# Patient Record
Sex: Female | Born: 1970 | Race: White | Hispanic: No | Marital: Single
Health system: Southern US, Community
[De-identification: ages and names within clinical notes are randomized; demographics above are authoritative.]

## PROBLEM LIST (undated history)

## (undated) DIAGNOSIS — J45909 Unspecified asthma, uncomplicated: Secondary | ICD-10-CM

## (undated) DIAGNOSIS — M1990 Unspecified osteoarthritis, unspecified site: Secondary | ICD-10-CM

## (undated) DIAGNOSIS — R42 Dizziness and giddiness: Secondary | ICD-10-CM

## (undated) DIAGNOSIS — K2970 Gastritis, unspecified, without bleeding: Secondary | ICD-10-CM

## (undated) DIAGNOSIS — K449 Diaphragmatic hernia without obstruction or gangrene: Secondary | ICD-10-CM

## (undated) DIAGNOSIS — Z91018 Allergy to other foods: Secondary | ICD-10-CM

## (undated) DIAGNOSIS — F419 Anxiety disorder, unspecified: Secondary | ICD-10-CM

## (undated) DIAGNOSIS — R55 Syncope and collapse: Secondary | ICD-10-CM

## (undated) HISTORY — DX: Unspecified asthma, uncomplicated: J45.909

## (undated) HISTORY — DX: Allergy to other foods: Z91.018

## (undated) HISTORY — PX: ADENOIDECTOMY: SUR15

## (undated) HISTORY — DX: Anxiety disorder, unspecified: F41.9

## (undated) HISTORY — DX: Dizziness and giddiness: R42

## (undated) HISTORY — PX: SHOULDER SURGERY: SHX246

## (undated) HISTORY — DX: Gastritis, unspecified, without bleeding: K29.70

## (undated) HISTORY — DX: Unspecified osteoarthritis, unspecified site: M19.90

## (undated) HISTORY — DX: Syncope and collapse: R55

## (undated) HISTORY — DX: Diaphragmatic hernia without obstruction or gangrene: K44.9

## (undated) HISTORY — PX: TONSILLECTOMY: SUR1361

## (undated) HISTORY — PX: KNEE SURGERY: SHX244

---

## 1998-12-27 ENCOUNTER — Inpatient Hospital Stay: Admission: AD | Admit: 1998-12-27 | Discharge: 1998-12-27 | Payer: Self-pay | Admitting: Obstetrics

## 1998-12-30 ENCOUNTER — Emergency Department (HOSPITAL_COMMUNITY): Admission: EM | Admit: 1998-12-30 | Discharge: 1998-12-30 | Payer: Self-pay | Admitting: Emergency Medicine

## 1998-12-30 ENCOUNTER — Encounter: Payer: Self-pay | Admitting: Emergency Medicine

## 1999-01-01 ENCOUNTER — Inpatient Hospital Stay (HOSPITAL_COMMUNITY): Admission: AD | Admit: 1999-01-01 | Discharge: 1999-01-03 | Payer: Self-pay | Admitting: Obstetrics & Gynecology

## 1999-01-02 ENCOUNTER — Encounter: Payer: Self-pay | Admitting: Obstetrics & Gynecology

## 1999-08-09 ENCOUNTER — Inpatient Hospital Stay (HOSPITAL_COMMUNITY): Admission: AD | Admit: 1999-08-09 | Discharge: 1999-08-11 | Payer: Self-pay | Admitting: Obstetrics and Gynecology

## 1999-08-16 ENCOUNTER — Encounter: Admission: RE | Admit: 1999-08-16 | Discharge: 1999-09-21 | Payer: Self-pay | Admitting: Obstetrics and Gynecology

## 2000-09-29 ENCOUNTER — Other Ambulatory Visit: Admission: RE | Admit: 2000-09-29 | Discharge: 2000-09-29 | Payer: Self-pay | Admitting: Obstetrics and Gynecology

## 2000-12-10 ENCOUNTER — Ambulatory Visit (HOSPITAL_COMMUNITY): Admission: RE | Admit: 2000-12-10 | Discharge: 2000-12-10 | Payer: Self-pay | Admitting: Orthopedic Surgery

## 2000-12-10 ENCOUNTER — Encounter: Payer: Self-pay | Admitting: Orthopedic Surgery

## 2001-08-24 ENCOUNTER — Other Ambulatory Visit: Admission: RE | Admit: 2001-08-24 | Discharge: 2001-08-24 | Payer: Self-pay | Admitting: Obstetrics and Gynecology

## 2002-07-04 ENCOUNTER — Other Ambulatory Visit: Admission: RE | Admit: 2002-07-04 | Discharge: 2002-07-04 | Payer: Self-pay | Admitting: Obstetrics and Gynecology

## 2002-07-18 ENCOUNTER — Encounter: Admission: RE | Admit: 2002-07-18 | Discharge: 2002-07-18 | Payer: Self-pay | Admitting: Gastroenterology

## 2002-07-18 ENCOUNTER — Encounter: Payer: Self-pay | Admitting: Gastroenterology

## 2003-01-28 ENCOUNTER — Inpatient Hospital Stay (HOSPITAL_COMMUNITY): Admission: AD | Admit: 2003-01-28 | Discharge: 2003-01-30 | Payer: Self-pay | Admitting: Obstetrics and Gynecology

## 2003-03-05 ENCOUNTER — Other Ambulatory Visit: Admission: RE | Admit: 2003-03-05 | Discharge: 2003-03-05 | Payer: Self-pay | Admitting: Obstetrics and Gynecology

## 2004-02-29 ENCOUNTER — Emergency Department (HOSPITAL_COMMUNITY): Admission: EM | Admit: 2004-02-29 | Discharge: 2004-02-29 | Payer: Self-pay | Admitting: Internal Medicine

## 2005-02-04 ENCOUNTER — Emergency Department (HOSPITAL_COMMUNITY): Admission: EM | Admit: 2005-02-04 | Discharge: 2005-02-04 | Payer: Self-pay | Admitting: Emergency Medicine

## 2005-02-12 ENCOUNTER — Encounter: Admission: RE | Admit: 2005-02-12 | Discharge: 2005-02-12 | Payer: Self-pay | Admitting: Neurology

## 2005-03-01 ENCOUNTER — Encounter: Admission: RE | Admit: 2005-03-01 | Discharge: 2005-03-01 | Payer: Self-pay | Admitting: Internal Medicine

## 2005-03-05 ENCOUNTER — Ambulatory Visit: Payer: Self-pay | Admitting: Internal Medicine

## 2005-04-28 ENCOUNTER — Ambulatory Visit: Payer: Self-pay | Admitting: Internal Medicine

## 2005-05-31 ENCOUNTER — Encounter: Admission: RE | Admit: 2005-05-31 | Discharge: 2005-05-31 | Payer: Self-pay | Admitting: Obstetrics and Gynecology

## 2006-04-27 ENCOUNTER — Ambulatory Visit: Payer: Self-pay | Admitting: Internal Medicine

## 2006-06-01 ENCOUNTER — Encounter: Admission: RE | Admit: 2006-06-01 | Discharge: 2006-06-01 | Payer: Self-pay | Admitting: Obstetrics and Gynecology

## 2007-03-03 ENCOUNTER — Ambulatory Visit: Payer: Self-pay | Admitting: Internal Medicine

## 2007-06-05 ENCOUNTER — Encounter: Admission: RE | Admit: 2007-06-05 | Discharge: 2007-06-05 | Payer: Self-pay | Admitting: Obstetrics and Gynecology

## 2007-06-08 ENCOUNTER — Encounter: Admission: RE | Admit: 2007-06-08 | Discharge: 2007-06-08 | Payer: Self-pay | Admitting: Obstetrics and Gynecology

## 2008-06-19 ENCOUNTER — Encounter: Admission: RE | Admit: 2008-06-19 | Discharge: 2008-06-19 | Payer: Self-pay | Admitting: Obstetrics and Gynecology

## 2009-07-01 ENCOUNTER — Encounter: Admission: RE | Admit: 2009-07-01 | Discharge: 2009-07-01 | Payer: Self-pay | Admitting: Obstetrics and Gynecology

## 2009-08-29 DIAGNOSIS — K449 Diaphragmatic hernia without obstruction or gangrene: Secondary | ICD-10-CM | POA: Insufficient documentation

## 2009-08-29 DIAGNOSIS — E785 Hyperlipidemia, unspecified: Secondary | ICD-10-CM | POA: Diagnosis present

## 2009-09-18 ENCOUNTER — Encounter: Admission: RE | Admit: 2009-09-18 | Discharge: 2009-09-18 | Payer: Self-pay | Admitting: *Deleted

## 2010-06-09 ENCOUNTER — Encounter: Admission: RE | Admit: 2010-06-09 | Discharge: 2010-06-09 | Payer: Self-pay | Admitting: Obstetrics and Gynecology

## 2010-06-09 ENCOUNTER — Encounter: Admission: RE | Admit: 2010-06-09 | Discharge: 2010-06-09 | Payer: Self-pay | Admitting: *Deleted

## 2010-07-02 ENCOUNTER — Emergency Department (HOSPITAL_COMMUNITY)
Admission: EM | Admit: 2010-07-02 | Discharge: 2010-07-02 | Payer: Self-pay | Source: Home / Self Care | Admitting: Family Medicine

## 2010-07-26 ENCOUNTER — Encounter: Payer: Self-pay | Admitting: Obstetrics and Gynecology

## 2010-09-14 LAB — POCT URINALYSIS DIPSTICK: Nitrite: NEGATIVE

## 2010-10-22 ENCOUNTER — Encounter: Payer: Self-pay | Admitting: Internal Medicine

## 2010-10-23 ENCOUNTER — Encounter: Payer: Self-pay | Admitting: Internal Medicine

## 2010-10-23 ENCOUNTER — Ambulatory Visit (INDEPENDENT_AMBULATORY_CARE_PROVIDER_SITE_OTHER): Payer: Medicare Other | Admitting: Internal Medicine

## 2010-10-23 VITALS — BP 114/81 | HR 76 | Ht 63.0 in | Wt 117.0 lb

## 2010-10-23 DIAGNOSIS — R55 Syncope and collapse: Secondary | ICD-10-CM

## 2010-10-23 NOTE — Patient Instructions (Signed)
Your physician recommends that you schedule a follow-up appointment in: 3 months with Dr Ladona Ridgel  Your physician has recommended you make the following change in your medication: start Florinef 0.1mg  1/2 tablet twice daily at 8am and 2 pm

## 2010-10-23 NOTE — Progress Notes (Signed)
HPI Ms. Bell returns today for followup. I have not seen her for several years. She has a history of neurally mediated syncope. She has done well for several years but over the last year has had increasing stress surrounding a divorce. About 2 months ago the patient had an episode of near syncope. She was standing and felt like her heart was beating slower and harder and as if she would pass out. The patient managed to lay down and her symptoms past. 2 weeks ago she had another episode where she felt like her spell is coming on and passed out. At that time she awoke and felt poorly but did not injure herself. Her symptoms were exactly like she had several years ago when she passed out. She does not eat meat but tries to increase her salt and fluid intake. Unfortunately she eats no food preservatives and hence her salt intake I suspect is actually quite low. Incidentally she notes that her son was diagnosed with neurally mediated syncope. No Known Allergies   Current Outpatient Prescriptions  Medication Sig Dispense Refill  . cyclobenzaprine (FLEXERIL) 5 MG tablet Take 5 mg by mouth 3 (three) times daily as needed.        Marland Kitchen HYDROcodone-acetaminophen (VICODIN) 5-500 MG per tablet Take 1 tablet by mouth every 6 (six) hours as needed.        Marland Kitchen ibuprofen (IBU) 800 MG tablet Take 800 mg by mouth every 8 (eight) hours as needed.        . Misc Natural Products (LUTEIN VISION BLEND PO) Take by mouth daily.        . Multiple Vitamins-Minerals (VITAMINS/MINERALS PO) Take by mouth daily.        . NON FORMULARY OPC-3       . sodium chloride (OCEAN) 0.65 % nasal spray 1 spray by Nasal route as needed.        . Vitamin D-Vitamin K TABS Take by mouth daily.           Past Medical History  Diagnosis Date  . Chronic arthritis   . Syncope and collapse   . Dizzy spells   . Gastritis   . Hiatal hernia   . Esophagitis   . Anxiety     ROS:   All systems reviewed and negative except as noted in the  HPI.   Past Surgical History  Procedure Date  . Shoulder surgery     right  . Knee surgery     left     Family History  Problem Relation Age of Onset  . Kidney disease      family hx of  . Hypertension      family hx of  . Diabetes      family hx of  . Liver disease      family hx of     History   Social History  . Marital Status: Legally Separated    Spouse Name: N/A    Number of Children: N/A  . Years of Education: N/A   Occupational History  . Not on file.   Social History Main Topics  . Smoking status: Never Smoker   . Smokeless tobacco: Not on file  . Alcohol Use: Not on file  . Drug Use: Not on file  . Sexually Active: Not on file   Other Topics Concern  . Not on file   Social History Narrative  . No narrative on file     BP 114/81  Pulse  76  Ht 5\' 3"  (1.6 m)  Wt 117 lb (53.071 kg)  BMI 20.73 kg/m2  Physical Exam:  Well appearing NAD HEENT: Unremarkable Neck:  No JVD, no thyromegally Lymphatics:  No adenopathy Back:  No CVA tenderness Lungs:  Clear. No wheezes. HEART:  Regular rate rhythm, no murmurs, no rubs, no clicks Abd:  Flat, positive bowel sounds, no organomegally, no rebound, no guarding Ext:  2 plus pulses, no edema, no cyanosis, no clubbing Skin:  No rashes no nodules Neuro:  CN II through XII intact, motor grossly intact  EKG Normal sinus rhythm with sinus arrhythmia.  Assess/Plan:

## 2010-10-23 NOTE — Assessment & Plan Note (Signed)
Today I have reviewed the pathophysiology of the patient's problem. She has been her under increasing stress. Her diet is not high in salt.  Today we discussed methods to increase salt in her diet. We also discussed the role of emotional stress on exacerbation of neurally mediated syncope. I talked as well about initiating medical therapy. The patient has a bias against medical therapy and for this reason we spent most of our time discussing ways to increase the salt in her diet. I've also discussed with her the importance of avoiding a syncopal episode. If an increase in for cystoscopy consults in the diet as well as fluid results in no recurrent syncope think she will continue in this way. Otherwise I would insist that she begin taking Florinef. I will see her back in 3 months.

## 2010-10-26 ENCOUNTER — Telehealth: Payer: Self-pay | Admitting: Internal Medicine

## 2010-10-26 NOTE — Telephone Encounter (Signed)
Pt is having a problem with the increase salt

## 2010-10-26 NOTE — Telephone Encounter (Signed)
Spoke with patient she did everything Dr Ladona Ridgel has asked her to do.  Now her BP is 130/85 and she has a bad HA.  I explained to her that she may have increased her salt too quickly and her body not being use to it this is what has happened.  I have asked her to continue with the increase in salt and try and see if the HA get better.  She verbalized the understanding.  Dr Ladona Ridgel aware and says that he is ok with her BP

## 2010-11-02 ENCOUNTER — Telehealth: Payer: Self-pay | Admitting: Internal Medicine

## 2010-11-02 NOTE — Telephone Encounter (Signed)
Per rhonda called this morning re pt syncope for taylor nurse to check on pt.

## 2010-11-02 NOTE — Telephone Encounter (Signed)
I spoke with patient and she says she passed out on Sat  I have advised her that I will talk with Dr Ladona Ridgel tomorrow and let her know what to do  She states that she is under a lot of stress and knows that this is what is the cause.  She got like this when her mother died but it is worse now.  Needs to know if she can take an herbal supp for stress reduction.  Knows it will take a couple of weeks but just is not feeling right.

## 2010-11-02 NOTE — Telephone Encounter (Signed)
Pt called because she had passed out again. She also c/o dizzyness and these Sx are worse with activity. She has taken VS and SBP has dropped some with position change but not less than  100. She is compliant with increasing salt and taking Rx as prescribed. She does not feel well but does not wish to come to ER. The ofc is aware.

## 2010-11-04 ENCOUNTER — Telehealth: Payer: Self-pay | Admitting: Internal Medicine

## 2010-11-04 NOTE — Telephone Encounter (Signed)
Spoke with pt, she is upset because she has not heard back from dr taylor on what she should do since she passed out on sat. She has not had any more episodes since sat but wants to be seen. Will discuss with dr cooper(DOD). Deliah Goody

## 2010-11-04 NOTE — Telephone Encounter (Signed)
Discussed pt with dr cooper, pt reports feeling lightheaded and dizzy but this is the consistent feeling she has had. She is on florinef but states it makes her sick. She cont to eat salt and drink plenty of fluids. Per dr copper pt will cont the same and see dr taylor tomorrow. Pt was cautioned about getting up and down and driving. She will call back today if problems occur Tracey Proctor

## 2010-11-04 NOTE — Telephone Encounter (Signed)
Pt was told kelly would talk with dr taylor yesterday and get back with her and she hasn't heard back, pt would like a call today, pt aware kelly not in today

## 2010-11-05 ENCOUNTER — Ambulatory Visit (INDEPENDENT_AMBULATORY_CARE_PROVIDER_SITE_OTHER): Payer: Medicare Other | Admitting: Internal Medicine

## 2010-11-05 ENCOUNTER — Encounter: Payer: Self-pay | Admitting: Internal Medicine

## 2010-11-05 VITALS — BP 122/86 | HR 72 | Ht 62.0 in | Wt 116.0 lb

## 2010-11-05 DIAGNOSIS — R55 Syncope and collapse: Secondary | ICD-10-CM

## 2010-11-05 NOTE — Progress Notes (Signed)
HPI Tracey Proctor returns today for followup. I had seen her a couple of weeks ago. Prior to that she had not been seen in our EP clinic for several years. She has a history of neurally mediated syncope. Her episodes have been well controlled until she is been through multiple stressors. When I saw her a couple of weeks ago, I recommended that she increase her salt and fluid intake and at that time recommended medical therapy with Florinef. She took 3 doses of this medication. Several days ago the patient passed out again. She notes that she had run approximately 3 miles earlier that day. She felt weak and tired and overall felt poorly. She got up to leave her friend's house and became lightheaded, nauseated, and passed out. She did not seriously injure herself. Since then she has been stable. She has checked her blood pressure on a regular basis both sitting and standing. This demonstrates an approximate 20 BP minute increase in the heart rate when she stands up. No significant change in her blood pressure. The patient has tried to cut back on her caffeine intake. She recently bought a house. No Known Allergies   Current Outpatient Prescriptions  Medication Sig Dispense Refill  . cyclobenzaprine (FLEXERIL) 5 MG tablet Take 5 mg by mouth 3 (three) times daily as needed.        Marland Kitchen HYDROcodone-acetaminophen (VICODIN) 5-500 MG per tablet Take 1 tablet by mouth every 6 (six) hours as needed.        Marland Kitchen ibuprofen (IBU) 800 MG tablet Take 800 mg by mouth every 8 (eight) hours as needed.        . Misc Natural Products (LUTEIN VISION BLEND PO) Take by mouth daily.        . Multiple Vitamins-Minerals (VITAMINS/MINERALS PO) Take by mouth daily.        . NON FORMULARY OPC-3       . sodium chloride (OCEAN) 0.65 % nasal spray 1 spray by Nasal route as needed.        . Vitamin D-Vitamin K TABS Take by mouth daily.        Marland Kitchen DISCONTD: fludrocortisone (FLORINEF) 0.1 MG tablet 1/2 tablet twice daily at 8am and 2pm          Past Medical History  Diagnosis Date  . Chronic arthritis   . Syncope and collapse   . Dizzy spells   . Gastritis   . Hiatal hernia   . Esophagitis   . Anxiety     ROS:   All systems reviewed and negative except as noted in the HPI.   Past Surgical History  Procedure Date  . Shoulder surgery     right  . Knee surgery     left     Family History  Problem Relation Age of Onset  . Kidney disease      family hx of  . Hypertension      family hx of  . Diabetes      family hx of  . Liver disease      family hx of     History   Social History  . Marital Status: Legally Separated    Spouse Name: N/A    Number of Children: N/A  . Years of Education: N/A   Occupational History  . Not on file.   Social History Main Topics  . Smoking status: Never Smoker   . Smokeless tobacco: Not on file  . Alcohol Use: Not on file  .  Drug Use: Not on file  . Sexually Active: Not on file   Other Topics Concern  . Not on file   Social History Narrative  . No narrative on file     BP 122/86  Pulse 72  Ht 5\' 2"  (1.575 m)  Wt 116 lb (52.617 kg)  BMI 21.22 kg/m2  Physical Exam: Her blood pressure equal 112/70 by my exam Well appearing NAD HEENT: Unremarkable Neck:  No JVD, no thyromegally Lymphatics:  No adenopathy Back:  No CVA tenderness Lungs:  Clear HEART:  Regular rate rhythm, no murmurs, no rubs, no clicks Abd:  Flat, positive bowel sounds, no organomegally, no rebound, no guarding Ext:  2 plus pulses, no edema, no cyanosis, no clubbing Skin:  No rashes no nodules Neuro:  CN II through XII intact, motor grossly intact  Assess/Plan:

## 2010-11-05 NOTE — Patient Instructions (Signed)
Your physician recommends that you schedule a follow-up appointment in: 6 weeks with Dr. Taylor  

## 2010-11-05 NOTE — Assessment & Plan Note (Signed)
I have discussed the multifactorial nature behind the patient's episodes of syncope. She is under fairly significant stress.  On the day that she passed out, she ran several miles. I discussed the importance of maintaining horizontal posture when she had an episode that occurs. Also, I discussed the importance of a high salt high fluid low caffeine diet. I continue to recommend that she take her Florinef but she states that she is somewhat reluctant. Other medications could be considered. We'll plan to see her back in several weeks.

## 2010-11-06 ENCOUNTER — Telehealth: Payer: Self-pay | Admitting: Internal Medicine

## 2010-11-06 NOTE — Telephone Encounter (Signed)
Faxed OV to [facility] [employer] ([phone]).

## 2010-11-17 NOTE — Letter (Signed)
March 03, 2007    Julieanne Cotton, MD  [address]  [place] [address]   RE:  Tracey Proctor, Tracey Proctor  MRN:  [id number]  /  DOB:  04/01/1971   Dr. Carney Living,   Today I saw a patient whom we both follow named Letta Pate for EP  followup.  As you know, she is a pleasant 40 year old woman with  shoulder problems but also carries with her a diagnosis of neurally  mediated syncope.  Over the last several years, I have seen her in our  EP clinic here in [place], and she has been fairly stable with regard  to prevention of syncope.  She does continue to have dizzy spells but  has learned to avoid episodes of syncope simply by lying down or sitting  down whenever an episode occurs.  She has also maintained a high sodium  diet at my request to help minimize episodes of syncope.  She notes that  she has had shoulder problems for several years and has had 2 shoulder  operations in the past.  She is now considering additional shoulder  surgery, as she continues to have chronic shoulder pain.  Ms. Shannan Harper  examination today was fairly unremarkable.  Her EKG demonstrates sinus  rhythm with normal axis and intervals.   Overall, Ms. Shannan Harper risk for major cardiovascular complications from  shoulder surgery or, for that matter, any operation would be quite low.  She exercises regularly and has no problems with angina or heart failure  symptoms.  I think she would be a very acceptable cardiovascular risk.  In the perioperative period, if she did develop some hypotension, then  aggressive sodium replacement would be warranted.  Please do not  hesitate to contact me for additional questions about Ms. Bell.    Sincerely,      Doylene Canning. Ladona Ridgel, MD  Electronically Signed    GWT/MedQ  DD: 03/03/2007  DT: 03/05/2007  Job #: [id number]   CC:    Fax [phone] Ms. Alvester Morin

## 2010-11-17 NOTE — Assessment & Plan Note (Signed)
[facility]                         ELECTROPHYSIOLOGY OFFICE NOTE   NAME:BELLShabria, Egley                        MRN:          [id number]  DATE:03/03/2007                            DOB:          [birth year]    Ms. Tracey Proctor returns today for preoperative evaluation prior to pending  shoulder surgery.  The patient is a very pleasant 40 year old woman with  a history of neurally mediated syncope, who also has a history of  chronic arthritic complaints as a consequence of shoulder surgery, which  was done several years ago.  The patient subsequently has continued to  have chronic pain and has sought additional evaluation by Dr. Julieanne Cotton over in [place].  She is considering a major shoulder operation.  The patient has a history of neurally mediated syncope in the past.  Since I saw her, she has not had any additional syncopal episodes but  has been very careful to increase her salt and fluid intake and knows  that when she feels an episode coming on, she is to lie down.  Doing  this, she has been able to not pass out.  She had no other complaints  today.   PHYSICAL EXAMINATION:  She is a pleasant, very well-appearing young  woman in no distress.  Blood pressure 92/66, pulse 72 and regular, respirations were 18.  Weight was 129 pounds.  NECK:  No jugular venous distention.  There was no thyromegaly.  LUNGS:  Clear bilaterally to auscultation.  There are no wheezes, rales  or rhonchi present.  CARDIOVASCULAR:  Regular rate and rhythm with a normal S1 and S2.  There  are no murmurs, rubs or gallops.  EXTREMITIES:  No clubbing, cyanosis or edema.   EKG demonstrates a normal sinus rhythm with normal axis and intervals.   IMPRESSION:  1. Neurally mediated syncope.  2. Chronic shoulder problems.   DISCUSSION:  I think Ms. Tracey Proctor would be just fine in terms of going ahead  with the surgery and her risk for major cardiovascular complications  would be quite  small.  She does have sometimes a susceptibility to a  drop in her blood pressure, and careful hydration would be in order  during surgery.     Doylene Canning. Ladona Ridgel, MD  Electronically Signed    GWT/MedQ  DD: 03/03/2007  DT: 03/05/2007  Job #: [id number]   cc:   Julieanne Cotton

## 2010-11-20 NOTE — Assessment & Plan Note (Signed)
[facility]                           ELECTROPHYSIOLOGY OFFICE NOTE   NAME:Tracey Proctor, Tracey Proctor                        MRN:          [id number]  DATE:04/27/2006                            DOB:          28-Jan-1971    Ms. Tracey Proctor returns today for follow up.  She is a very pleasant young woman  with a history of neurally mediated syncope and chronic anxiety  difficulties, who returns for follow up.  It has been a year since we have  seen her, and she has overall been stable without any recurrent syncope.  She notes that she has had trouble somewhat with her diet, in that she does  not particularly like to maintain a high salt diet and notes that  occasionally she has some peripheral edema in her lower extremities,  particularly around the ankles, particularly at night.  This is always  relieved in the morning.  The patient has denied syncopal episodes in the  last year as noted.  She denies chest pain or shortness of breath.  She is  exercising intermittently.  She has tried to maintain an increased fluid  diet and stay away from caffeinated beverages.   PHYSICAL EXAMINATION:  GENERAL:  She is a pleasant, well-appearing woman in  no distress.  VITAL SIGNS:  Blood pressure 116/81, pulse 58 and regular, respirations 18,  weight 135 pounds.  NECK:  No jugular venous distention.  LUNGS:  Clear bilaterally to auscultation.  There are no wheezes, rales, or  rhonchi.  CARDIOVASCULAR:  Regular rate and rhythm with normal S1 and S2.  EXTREMITIES:  No cyanosis, clubbing, or edema.   The EKG demonstrates sinus bradycardia with normal axis and intervals and no  ST-T wave abnormalities.   IMPRESSION:  1. Recurrent syncope.  2. Chronic anxiety.   DISCUSSION:  Today we spent a fair amount of time talking about dietary  options for the patient as she is concerned about eating preservatives and  worried about eating foods that are high in fat and calories.  She does  admit to some lack of protein in her diet.  I recommended that she consider  branching out and eating fish, though she states that she is concerned about  the  mercury.  I have tried to reassure her that this is not an issue in general.  I will plan to see her back in a year, sooner should she have recurrent  syncopal problems.            ______________________________  Tracey Proctor. Tracey Ridgel, MD     GWT/MedQ  DD:  04/27/2006  DT:  04/28/2006  Job #:  [id number]   cc:   Barry Dienes. Eloise Harman, M.D.

## 2010-11-20 NOTE — H&P (Signed)
   Tracey Proctor, ROBLEDO                           ACCOUNT NO.:  [id number]   MEDICAL RECORD NO.:  [id number]                   PATIENT TYPE:  INP   LOCATION:  9121                                 FACILITY:  [facility]   PHYSICIAN:  Lenoard Aden, M.D.             DATE OF BIRTH:  08-Aug-1970   DATE OF ADMISSION:  01/28/2003  DATE OF DISCHARGE:                                HISTORY & PHYSICAL   CHIEF COMPLAINT:  Active labor.   HISTORY OF PRESENT ILLNESS:  The patient is a 40 year old white female, G2,  P1, EDD of February 06, 2003, at 39 weeks, who presents in active labor.   ALLERGIES:  LACTOSE.   MEDICATIONS:  Prenatal vitamins.   PAST SURGICAL HISTORY:  1. Right shoulder reconstruction.  2. Left knee surgery.  3  Spontaneous vaginal delivery.   PAST MEDICAL HISTORY:  1. Gestational anemia.  2. Hiatal hernia.  3. Gastritis.  4. Esophagitis.   FAMILY HISTORY:  Liver disease, kidney stones, hypertension, and diabetes.   PRENATAL LABORATORY DATA:  Blood type O+.  Rubella immune.  Hepatitis and  HIV negative.   PHYSICAL EXAMINATION:  GENERAL APPEARANCE:  She is a well-developed, well-  nourished, white female in no acute distress.  HEENT:  Normal.  LUNGS:  Clear.  HEART:  Regular rate and rhythm.  ABDOMEN:  Soft, gravid, and nontender.  Estimated fetal weight 6-1/2 to 7  pounds.  PELVIC:  The cervix is 3 cm, 100%, vertex, and 0.  EXTREMITIES:  No cords.  NEUROLOGIC:  Nonfocal.   IMPRESSION:  Term intrauterine pregnancy in active labor.    PLAN:  1. Admit to [facility].  2. Epidural p.r.n.  3. Anticipate attempt at vaginal delivery.                                               Lenoard Aden, M.D.    RJT/MEDQ  D:  01/28/2003  T:  01/28/2003  Job:  [id number]

## 2011-01-13 ENCOUNTER — Telehealth: Payer: Self-pay | Admitting: Internal Medicine

## 2011-01-13 NOTE — Telephone Encounter (Signed)
Pt was called to confirm appt for tomorrow and she cxl said dr Tracey Proctor said ok to cxl if doing ok and she is, she wants to know if and when she should reschedule?

## 2011-01-13 NOTE — Telephone Encounter (Signed)
lmom for pt that I would discuss with Dr Ladona Ridgel appt status and call her back

## 2011-01-14 ENCOUNTER — Ambulatory Visit: Payer: Medicare Other | Admitting: Internal Medicine

## 2011-01-19 NOTE — Telephone Encounter (Signed)
Discussed with Dr Ladona Ridgel and okay to follow up in 6 months

## 2011-01-20 NOTE — Telephone Encounter (Signed)
Pt aware to follow up in 6 months

## 2011-03-23 ENCOUNTER — Telehealth: Payer: Self-pay | Admitting: Internal Medicine

## 2011-03-23 NOTE — Telephone Encounter (Signed)
Pt would like to speak with nurse regarding a episode that happened. Please return call to discuss further.

## 2011-03-23 NOTE — Telephone Encounter (Signed)
Had an episode--last night at 8pm sitting on couch She went to lean back,(like streching No child support for 1 1/2 years) then she got up, feel out.  Having health issues with son (OCD, anxiety he's 11)

## 2011-03-23 NOTE — Telephone Encounter (Signed)
Tracey Proctor feels like a lot of her problems are due to the STRESS Tracey Proctor is under and just wants to know if there is anything different Dr Ladona Ridgel wants to do  Tracey Proctor is still taking in a lot of salt and fluids  Will discuss with Dr Ladona Ridgel tomorrow and call her back

## 2011-03-24 NOTE — Telephone Encounter (Signed)
Dr Ladona Ridgel recommends starting Florinef 0.1mg  bid  And keep her follow up appointment in 04/2011

## 2011-03-29 ENCOUNTER — Encounter: Payer: Self-pay | Admitting: Internal Medicine

## 2011-03-29 NOTE — Telephone Encounter (Signed)
Pt called she is returning your call

## 2011-03-30 MED ORDER — FLUDROCORTISONE ACETATE 0.1 MG PO TABS: 0.1000 mg | ORAL_TABLET | Freq: Two times a day (BID) | ORAL | Status: DC

## 2011-03-30 NOTE — Telephone Encounter (Signed)
This encounter was created in error - please disregard.

## 2011-03-30 NOTE — Telephone Encounter (Signed)
Returned call to patient  She is going to start Florinef 0.1mg  twice daily and let us know if this helps

## 2011-05-06 ENCOUNTER — Other Ambulatory Visit: Payer: Self-pay | Admitting: Obstetrics and Gynecology

## 2011-05-06 DIAGNOSIS — Z1231 Encounter for screening mammogram for malignant neoplasm of breast: Secondary | ICD-10-CM

## 2011-06-09 ENCOUNTER — Other Ambulatory Visit: Payer: Self-pay | Admitting: Allergy and Immunology

## 2011-06-09 ENCOUNTER — Ambulatory Visit
Admission: RE | Admit: 2011-06-09 | Discharge: 2011-06-09 | Disposition: A | Discharge status: Home / Self Care | Payer: Medicare Other | Source: Ambulatory Visit | Attending: Allergy and Immunology | Admitting: Allergy and Immunology

## 2011-06-09 DIAGNOSIS — R059 Cough, unspecified: Secondary | ICD-10-CM

## 2011-06-09 DIAGNOSIS — R05 Cough: Secondary | ICD-10-CM

## 2011-06-10 ENCOUNTER — Emergency Department (HOSPITAL_COMMUNITY)
Admission: EM | Admit: 2011-06-10 | Discharge: 2011-06-10 | Disposition: A | Discharge status: Home / Self Care | Payer: Medicare Other | Attending: Emergency Medicine | Admitting: Emergency Medicine

## 2011-06-10 ENCOUNTER — Encounter (HOSPITAL_COMMUNITY): Payer: Self-pay | Admitting: *Deleted

## 2011-06-10 ENCOUNTER — Other Ambulatory Visit: Payer: Self-pay

## 2011-06-10 DIAGNOSIS — R55 Syncope and collapse: Secondary | ICD-10-CM

## 2011-06-10 DIAGNOSIS — R197 Diarrhea, unspecified: Secondary | ICD-10-CM | POA: Insufficient documentation

## 2011-06-10 DIAGNOSIS — R112 Nausea with vomiting, unspecified: Secondary | ICD-10-CM

## 2011-06-10 LAB — BASIC METABOLIC PANEL
CO2: 24 mEq/L (ref 19–32)
Calcium: 9.4 mg/dL (ref 8.4–10.5)
Chloride: 105 mEq/L (ref 96–112)
Glucose, Bld: 94 mg/dL (ref 70–99)
Sodium: 137 mEq/L (ref 135–145)

## 2011-06-10 LAB — CBC
Hemoglobin: 11.8 g/dL — ABNORMAL LOW (ref 12.0–15.0)
MCH: 25.8 pg — ABNORMAL LOW (ref 26.0–34.0)
RBC: 4.58 MIL/uL (ref 3.87–5.11)

## 2011-06-10 LAB — URINALYSIS, ROUTINE W REFLEX MICROSCOPIC
Glucose, UA: NEGATIVE mg/dL
Hgb urine dipstick: NEGATIVE
Specific Gravity, Urine: 1.014 (ref 1.005–1.030)
pH: 7.5 (ref 5.0–8.0)

## 2011-06-10 LAB — PREGNANCY, URINE: Preg Test, Ur: NEGATIVE

## 2011-06-10 MED ORDER — ONDANSETRON 8 MG PO TBDP: 8.0000 mg | ORAL_TABLET | Freq: Three times a day (TID) | ORAL | Status: AC | PRN

## 2011-06-10 MED ORDER — ONDANSETRON 8 MG PO TBDP: 8.0000 mg | ORAL_TABLET | Freq: Three times a day (TID) | ORAL | Status: DC | PRN

## 2011-06-10 MED ORDER — ONDANSETRON HCL 4 MG/2ML IJ SOLN
INTRAMUSCULAR | Status: AC
  Administered 2011-06-10: 18:00:00
  Filled 2011-06-10: qty 2

## 2011-06-10 MED ORDER — SODIUM CHLORIDE 0.9 % IV BOLUS (SEPSIS)
500.0000 mL | INTRAVENOUS | Status: AC
  Administered 2011-06-10: 500 mL via INTRAVENOUS

## 2011-06-10 NOTE — ED Notes (Signed)
WUJ:[id number]<JY> Expected date:06/10/11<BR> Expected time: 5:38 PM<BR> Means of arrival:Ambulance<BR> Comments:<BR> EMS 61 GC - syncope

## 2011-06-10 NOTE — ED Provider Notes (Signed)
History     CSN: [id number] Arrival date & time: 06/10/2011  5:47 PM   First MD Initiated Contact with Patient 06/10/11 1814      Chief Complaint  Patient presents with  . Loss of Consciousness    (Consider location/radiation/quality/duration/timing/severity/associated sxs/prior treatment) HPI History provided by pt.   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  Has had several syncopal episodes in past for which she is followed by Dr. Ladona Ridgel and maintaining a high salt and high fluid diet.  Per prior chart, Dr. Ladona Ridgel believes her sx are exacerbated by high stress level. Pt reprots 3-4 syncopal episodes today.  Has had a funny feeling in her head for the past 2 days, the same feeling that past syncopal episodes have been associated with.  Came to ED today because episodes have never occurred in multiples.  Had both diarrhea and vomiting this am after eating breakfast.  Has also had a cough for several weeks.  Has been treated w/ prednisone and abx w/out improvement.  Denies fever.   CXR obtained yesterday and neg.  Denies head trauma.  Currently feels well after receiving IV fluids.  Past Medical History  Diagnosis Date  . Chronic arthritis   . Syncope and collapse   . Dizzy spells   . Gastritis   . Hiatal hernia   . Esophagitis   . Anxiety     Past Surgical History  Procedure Date  . Shoulder surgery     right  . Knee surgery     left    Family History  Problem Relation Age of Onset  . Kidney disease      family hx of  . Hypertension      family hx of  . Diabetes      family hx of  . Liver disease      family hx of    History  Substance Use Topics  . Smoking status: Never Smoker   . Smokeless tobacco: Not on file  . Alcohol Use: Not on file    OB History    Grav Para Term Preterm Abortions TAB SAB Ect Mult Living                  Review of Systems  All other systems reviewed and are negative.    Allergies  Review of patient's  allergies indicates no known allergies.  Home Medications   Current Outpatient Rx  Name Route Sig Dispense Refill  . BECLOMETHASONE DIPROPIONATE 40 MCG/ACT IN AERS Inhalation Inhale 2 puffs into the lungs 2 (two) times daily.      . CYCLOBENZAPRINE HCL 5 MG PO TABS Oral Take 5 mg by mouth 3 (three) times daily as needed.      Marland Kitchen FLUDROCORTISONE ACETATE 0.1 MG PO TABS Oral Take 1 tablet (0.1 mg total) by mouth 2 (two) times daily. 60 tablet 6  . HYDROCODONE-ACETAMINOPHEN 5-500 MG PO TABS Oral Take 1 tablet by mouth every 6 (six) hours as needed.      . IBUPROFEN 800 MG PO TABS Oral Take 800 mg by mouth every 8 (eight) hours as needed.      . LUTEIN VISION BLEND PO Oral Take by mouth daily.      Marland Kitchen VITAMINS/MINERALS PO Oral Take by mouth daily.      Marland Kitchen PRILOSEC OTC PO Oral Take 1 tablet by mouth.      Marland Kitchen VITAMIN D-VITAMIN K PO TABS Oral Take by mouth daily.      Marland Kitchen  ALBUTEROL SULFATE HFA 108 (90 BASE) MCG/ACT IN AERS Inhalation Inhale 2 puffs into the lungs every 6 (six) hours as needed.        BP 128/75  Pulse 74  Temp(Src) 98.3 F (36.8 C) (Oral)  Resp 20  SpO2 100%  Physical Exam  Nursing note and vitals reviewed. Constitutional: She is oriented to person, place, and time. She appears well-developed and well-nourished. No distress.  HENT:  Head: Normocephalic and atraumatic.  Mouth/Throat: Oropharynx is clear and moist.  Eyes: Conjunctivae are normal.       Normal appearance  Neck: Normal range of motion.  Cardiovascular: Normal rate, regular rhythm and normal heart sounds.   Pulmonary/Chest: Effort normal and breath sounds normal.  Abdominal: Soft. Bowel sounds are normal. She exhibits no distension. There is no tenderness.  Musculoskeletal: She exhibits no edema and no tenderness.  Neurological: She is alert and oriented to person, place, and time.  Skin: Skin is warm and dry. No rash noted.  Psychiatric: She has a normal mood and affect. Her behavior is normal.    ED Course    Procedures (including critical care time)   Date: 06/10/2011  Rate: 75  Rhythm: normal sinus rhythm  QRS Axis: normal  Intervals: normal  ST/T Wave abnormalities: normal  Conduction Disutrbances:none  Narrative Interpretation:   Old EKG Reviewed: unchanged   Labs Reviewed  CBC - Abnormal; Notable for the following:    Hemoglobin 11.8 (*)    HCT 35.8 (*)    MCH 25.8 (*)    All other components within normal limits  BASIC METABOLIC PANEL  URINALYSIS, ROUTINE W REFLEX MICROSCOPIC  PREGNANCY, URINE   Dg Chest 2 View  06/09/2011  *RADIOLOGY REPORT*  Clinical Data: Cough and shortness of breath.  CHEST - 2 VIEW  Comparison: No comparison studies available.  Findings: The lungs are clear without focal consolidation, edema, effusion or pneumothorax.  Cardiopericardial silhouette is within normal limits for size.  Imaged bony structures of the thorax are intact.  IMPRESSION: Normal exam  Original Report Authenticated By: ERIC A. MANSELL, M.D.     1. Syncope   2. Nausea vomiting and diarrhea       MDM  Pt has neurally mediated syncope and is followed by Dr. Ladona Ridgel.  Had 3-4 syncopal episodes today.  Likely d/t recent emotional stress.  Currently feeling better after 1/2 L NS bolus.  No acute findings on exam.  EKG shows sinus rhythm and is non-ischemic.  Labs unremarkable w/ exception of urinalysis which is pending.  Will recheck vital signs and d/c home when urine results.  Recommended close f/u with Dr. Ladona Ridgel.          Arie Sabina [place], [place] 06/11/11 [id number]

## 2011-06-10 NOTE — ED Notes (Signed)
Per EMS- pt in s/p syncopal episode, pt with history of syncopal episodes and is supposed to be on high sodium diet for this. Pt states syncope is also triggered by stress and she has been under a lot of stress lately and not following her diet. Pt states she had two syncopal episodes during day and two with EMS present. Pt anxious at this time, CBG 126, pt with 18g in LAC, given 4mg  zofran PTA and fluid bolus initiated.

## 2011-06-11 ENCOUNTER — Telehealth: Payer: Self-pay | Admitting: Internal Medicine

## 2011-06-11 ENCOUNTER — Encounter: Payer: Self-pay | Admitting: Internal Medicine

## 2011-06-11 ENCOUNTER — Ambulatory Visit (INDEPENDENT_AMBULATORY_CARE_PROVIDER_SITE_OTHER): Payer: Medicare Other | Admitting: Internal Medicine

## 2011-06-11 ENCOUNTER — Ambulatory Visit: Payer: Medicare Other

## 2011-06-11 VITALS — BP 122/74 | HR 75 | Ht 63.0 in | Wt 117.0 lb

## 2011-06-11 DIAGNOSIS — R55 Syncope and collapse: Secondary | ICD-10-CM

## 2011-06-11 MED ORDER — FLUDROCORTISONE ACETATE 0.1 MG PO TABS: 0.1000 mg | ORAL_TABLET | Freq: Two times a day (BID) | ORAL | Status: DC

## 2011-06-11 NOTE — Telephone Encounter (Signed)
New problem Pt was in er yesterday for syncope and they told her to followup with Dr Ladona Ridgel today.

## 2011-06-11 NOTE — Patient Instructions (Signed)
Your physician recommends that you schedule a follow-up appointment in: 6 weeks  Start Florinef 0.1 mg twice a day

## 2011-06-11 NOTE — Telephone Encounter (Signed)
Spoke with pt and Dr. Ladona Ridgel.  Pt scheduled to see Dr. Ladona Ridgel on 06/15/11.  Pt notified.

## 2011-06-13 ENCOUNTER — Encounter: Payer: Self-pay | Admitting: Internal Medicine

## 2011-06-13 NOTE — Progress Notes (Signed)
HPI Tracey Proctor returns today for followup. She is a pleasant 40 yo woman with a h/o severe neurally mediated syncope. She was seen by me several months ago. At that time she was going through a very bitter divorce which unfortunately persists. The patient has been non-compliant with her meds as well as her diet. She experienced fairly profound nausea and vomiting and passed out several times. She admits to not taking her florinef recently. After her episodes, she feels very weak. These spells eventually resolve. She is anxious as her youngest child has been hopitalized with depression and suicidal ideation. She also notes some bronchitis symptoms and was placed on anti-biotics and steroids. No Known Allergies   Current Outpatient Prescriptions  Medication Sig Dispense Refill  . albuterol (PROVENTIL HFA;VENTOLIN HFA) 108 (90 BASE) MCG/ACT inhaler Inhale 2 puffs into the lungs every 6 (six) hours as needed.        . beclomethasone (QVAR) 40 MCG/ACT inhaler Inhale 2 puffs into the lungs 2 (two) times daily.        . cefUROXime (CEFTIN) 250 MG tablet Take 250 mg by mouth 2 (two) times daily.        . cyclobenzaprine (FLEXERIL) 5 MG tablet Take 5 mg by mouth 3 (three) times daily as needed.        . fludrocortisone (FLORINEF) 0.1 MG tablet Take 1 tablet (0.1 mg total) by mouth 2 (two) times daily.  60 tablet  6  . HYDROcodone-acetaminophen (VICODIN) 5-500 MG per tablet Take 1 tablet by mouth every 6 (six) hours as needed.        Marland Kitchen ibuprofen (IBU) 800 MG tablet Take 800 mg by mouth every 8 (eight) hours as needed.        . Misc Natural Products (LUTEIN VISION BLEND PO) Take by mouth daily.        . Multiple Vitamins-Minerals (VITAMINS/MINERALS PO) Take by mouth daily.        . Omeprazole Magnesium (PRILOSEC OTC PO) Take 1 tablet by mouth.        . ondansetron (ZOFRAN ODT) 8 MG disintegrating tablet Take 1 tablet (8 mg total) by mouth every 8 (eight) hours as needed for nausea.  20 tablet  0  . predniSONE  (DELTASONE) 10 MG tablet Take 10 mg by mouth 2 (two) times daily.        . Vitamin D-Vitamin K TABS Take by mouth daily.        . fludrocortisone (FLORINEF) 0.1 MG tablet Take 1 tablet (0.1 mg total) by mouth 2 (two) times daily.  60 tablet  6     Past Medical History  Diagnosis Date  . Chronic arthritis   . Syncope and collapse   . Dizzy spells   . Gastritis   . Hiatal hernia   . Esophagitis   . Anxiety     ROS:   All systems reviewed and negative except as noted in the HPI.   Past Surgical History  Procedure Date  . Shoulder surgery     right  . Knee surgery     left     Family History  Problem Relation Age of Onset  . Kidney disease      family hx of  . Hypertension      family hx of  . Diabetes      family hx of  . Liver disease      family hx of     History   Social History  .  Marital Status: Legally Separated    Spouse Name: N/A    Number of Children: N/A  . Years of Education: N/A   Occupational History  . Not on file.   Social History Main Topics  . Smoking status: Never Smoker   . Smokeless tobacco: Not on file  . Alcohol Use: Not on file  . Drug Use: Not on file  . Sexually Active: Not on file   Other Topics Concern  . Not on file   Social History Narrative  . No narrative on file     BP 122/74  Pulse 75  Ht 5\' 3"  (1.6 m)  Wt 53.071 kg (117 lb)  BMI 20.73 kg/m2  Physical Exam:  Well appearing NAD HEENT: Unremarkable Neck:  No JVD, no thyromegally Lungs:  Clear with no wheezes, rales, or rhonchi. HEART:  Regular rate rhythm, no murmurs, no rubs, no clicks Abd:  soft, positive bowel sounds, no organomegally, no rebound, no guarding Ext:  2 plus pulses, no edema, no cyanosis, no clubbing Skin:  No rashes no nodules Neuro:  CN II through XII intact, motor grossly intact  Assess/Plan:

## 2011-06-13 NOTE — Assessment & Plan Note (Signed)
She has had an exacerbation secondary to increased stress and not being on her meds. I have asked her to restart her florinef and increase her salt and fluid intake. Mechanisms to cope with her stress have been discussed as well as how she might prevent the episodes by lying down when she feels an episode coming on.

## 2011-06-14 NOTE — ED Provider Notes (Signed)
Medical screening examination/treatment/procedure(s) were performed by non-physician practitioner and as supervising physician I was immediately available for consultation/collaboration.   Harleigh Civello A. Patrica Duel, MD 06/14/11 [phone]

## 2011-06-15 ENCOUNTER — Ambulatory Visit: Payer: Medicare Other | Admitting: Internal Medicine

## 2011-06-16 ENCOUNTER — Ambulatory Visit: Payer: Medicare Other

## 2011-07-19 ENCOUNTER — Ambulatory Visit
Admission: RE | Admit: 2011-07-19 | Discharge: 2011-07-19 | Disposition: A | Discharge status: Home / Self Care | Payer: Medicare Other | Source: Ambulatory Visit | Attending: Obstetrics and Gynecology | Admitting: Obstetrics and Gynecology

## 2011-07-19 DIAGNOSIS — Z1231 Encounter for screening mammogram for malignant neoplasm of breast: Secondary | ICD-10-CM

## 2011-10-27 DIAGNOSIS — D239 Other benign neoplasm of skin, unspecified: Secondary | ICD-10-CM | POA: Diagnosis not present

## 2011-10-27 DIAGNOSIS — D234 Other benign neoplasm of skin of scalp and neck: Secondary | ICD-10-CM | POA: Diagnosis not present

## 2011-10-27 DIAGNOSIS — D235 Other benign neoplasm of skin of trunk: Secondary | ICD-10-CM | POA: Diagnosis not present

## 2011-10-27 DIAGNOSIS — D485 Neoplasm of uncertain behavior of skin: Secondary | ICD-10-CM | POA: Diagnosis not present

## 2011-12-08 DIAGNOSIS — Z01419 Encounter for gynecological examination (general) (routine) without abnormal findings: Secondary | ICD-10-CM | POA: Diagnosis not present

## 2011-12-08 DIAGNOSIS — Z113 Encounter for screening for infections with a predominantly sexual mode of transmission: Secondary | ICD-10-CM | POA: Diagnosis not present

## 2011-12-08 DIAGNOSIS — Z124 Encounter for screening for malignant neoplasm of cervix: Secondary | ICD-10-CM | POA: Diagnosis not present

## 2011-12-14 DIAGNOSIS — Z708 Other sex counseling: Secondary | ICD-10-CM | POA: Diagnosis not present

## 2012-01-25 DIAGNOSIS — Z9889 Other specified postprocedural states: Secondary | ICD-10-CM | POA: Diagnosis not present

## 2012-01-25 DIAGNOSIS — M899 Disorder of bone, unspecified: Secondary | ICD-10-CM | POA: Diagnosis not present

## 2012-01-25 DIAGNOSIS — G8929 Other chronic pain: Secondary | ICD-10-CM | POA: Diagnosis not present

## 2012-01-25 DIAGNOSIS — M25519 Pain in unspecified shoulder: Secondary | ICD-10-CM | POA: Diagnosis not present

## 2012-01-25 DIAGNOSIS — M949 Disorder of cartilage, unspecified: Secondary | ICD-10-CM | POA: Diagnosis not present

## 2012-01-25 DIAGNOSIS — M19019 Primary osteoarthritis, unspecified shoulder: Secondary | ICD-10-CM | POA: Diagnosis not present

## 2012-01-25 DIAGNOSIS — K7689 Other specified diseases of liver: Secondary | ICD-10-CM | POA: Diagnosis not present

## 2012-01-25 DIAGNOSIS — M25819 Other specified joint disorders, unspecified shoulder: Secondary | ICD-10-CM | POA: Diagnosis not present

## 2012-01-25 DIAGNOSIS — R071 Chest pain on breathing: Secondary | ICD-10-CM | POA: Diagnosis not present

## 2012-02-28 DIAGNOSIS — N899 Noninflammatory disorder of vagina, unspecified: Secondary | ICD-10-CM | POA: Diagnosis not present

## 2012-03-29 DIAGNOSIS — M629 Disorder of muscle, unspecified: Secondary | ICD-10-CM | POA: Diagnosis not present

## 2012-03-31 DIAGNOSIS — M629 Disorder of muscle, unspecified: Secondary | ICD-10-CM | POA: Diagnosis not present

## 2012-04-04 DIAGNOSIS — M629 Disorder of muscle, unspecified: Secondary | ICD-10-CM | POA: Diagnosis not present

## 2012-04-10 DIAGNOSIS — M629 Disorder of muscle, unspecified: Secondary | ICD-10-CM | POA: Diagnosis not present

## 2012-05-02 ENCOUNTER — Ambulatory Visit (HOSPITAL_COMMUNITY): Payer: Medicare Other | Admitting: Marriage and Family Therapist

## 2012-05-10 DIAGNOSIS — L290 Pruritus ani: Secondary | ICD-10-CM | POA: Diagnosis not present

## 2012-05-16 DIAGNOSIS — H18839 Recurrent erosion of cornea, unspecified eye: Secondary | ICD-10-CM | POA: Diagnosis not present

## 2012-05-16 DIAGNOSIS — H52229 Regular astigmatism, unspecified eye: Secondary | ICD-10-CM | POA: Diagnosis not present

## 2012-05-16 DIAGNOSIS — H43819 Vitreous degeneration, unspecified eye: Secondary | ICD-10-CM | POA: Diagnosis not present

## 2012-05-16 DIAGNOSIS — H43399 Other vitreous opacities, unspecified eye: Secondary | ICD-10-CM | POA: Diagnosis not present

## 2012-06-09 ENCOUNTER — Other Ambulatory Visit: Payer: Self-pay | Admitting: Obstetrics and Gynecology

## 2012-06-09 DIAGNOSIS — Z1231 Encounter for screening mammogram for malignant neoplasm of breast: Secondary | ICD-10-CM

## 2012-06-12 DIAGNOSIS — S46819A Strain of other muscles, fascia and tendons at shoulder and upper arm level, unspecified arm, initial encounter: Secondary | ICD-10-CM | POA: Diagnosis not present

## 2012-06-12 DIAGNOSIS — S43499A Other sprain of unspecified shoulder joint, initial encounter: Secondary | ICD-10-CM | POA: Diagnosis not present

## 2012-06-12 DIAGNOSIS — Z9889 Other specified postprocedural states: Secondary | ICD-10-CM | POA: Diagnosis not present

## 2012-07-25 ENCOUNTER — Ambulatory Visit
Admission: RE | Admit: 2012-07-25 | Discharge: 2012-07-25 | Disposition: A | Discharge status: Home / Self Care | Payer: Medicare Other | Source: Ambulatory Visit | Attending: Obstetrics and Gynecology | Admitting: Obstetrics and Gynecology

## 2012-07-25 DIAGNOSIS — Z1231 Encounter for screening mammogram for malignant neoplasm of breast: Secondary | ICD-10-CM

## 2012-09-07 DIAGNOSIS — Z79899 Other long term (current) drug therapy: Secondary | ICD-10-CM | POA: Diagnosis not present

## 2012-09-07 DIAGNOSIS — M25519 Pain in unspecified shoulder: Secondary | ICD-10-CM | POA: Diagnosis not present

## 2012-09-07 DIAGNOSIS — M79609 Pain in unspecified limb: Secondary | ICD-10-CM | POA: Diagnosis not present

## 2012-10-02 DIAGNOSIS — D2360 Other benign neoplasm of skin of unspecified upper limb, including shoulder: Secondary | ICD-10-CM | POA: Diagnosis not present

## 2012-10-02 DIAGNOSIS — D485 Neoplasm of uncertain behavior of skin: Secondary | ICD-10-CM | POA: Diagnosis not present

## 2012-10-02 DIAGNOSIS — D2330 Other benign neoplasm of skin of unspecified part of face: Secondary | ICD-10-CM | POA: Diagnosis not present

## 2012-10-02 DIAGNOSIS — D234 Other benign neoplasm of skin of scalp and neck: Secondary | ICD-10-CM | POA: Diagnosis not present

## 2012-10-02 DIAGNOSIS — D235 Other benign neoplasm of skin of trunk: Secondary | ICD-10-CM | POA: Diagnosis not present

## 2012-10-02 DIAGNOSIS — D239 Other benign neoplasm of skin, unspecified: Secondary | ICD-10-CM | POA: Diagnosis not present

## 2012-10-23 DIAGNOSIS — S93609A Unspecified sprain of unspecified foot, initial encounter: Secondary | ICD-10-CM | POA: Diagnosis not present

## 2012-10-23 DIAGNOSIS — S93699A Other sprain of unspecified foot, initial encounter: Secondary | ICD-10-CM | POA: Diagnosis not present

## 2012-10-24 DIAGNOSIS — M2550 Pain in unspecified joint: Secondary | ICD-10-CM | POA: Diagnosis not present

## 2012-10-24 DIAGNOSIS — M79609 Pain in unspecified limb: Secondary | ICD-10-CM | POA: Diagnosis not present

## 2012-11-01 DIAGNOSIS — L909 Atrophic disorder of skin, unspecified: Secondary | ICD-10-CM | POA: Diagnosis not present

## 2012-11-01 DIAGNOSIS — D235 Other benign neoplasm of skin of trunk: Secondary | ICD-10-CM | POA: Diagnosis not present

## 2012-11-01 DIAGNOSIS — D485 Neoplasm of uncertain behavior of skin: Secondary | ICD-10-CM | POA: Diagnosis not present

## 2012-11-01 DIAGNOSIS — D2330 Other benign neoplasm of skin of unspecified part of face: Secondary | ICD-10-CM | POA: Diagnosis not present

## 2012-11-01 DIAGNOSIS — L919 Hypertrophic disorder of the skin, unspecified: Secondary | ICD-10-CM | POA: Diagnosis not present

## 2012-12-13 DIAGNOSIS — Z124 Encounter for screening for malignant neoplasm of cervix: Secondary | ICD-10-CM | POA: Diagnosis not present

## 2012-12-13 DIAGNOSIS — Z01419 Encounter for gynecological examination (general) (routine) without abnormal findings: Secondary | ICD-10-CM | POA: Diagnosis not present

## 2013-02-15 ENCOUNTER — Ambulatory Visit: Payer: Self-pay | Admitting: Psychology

## 2013-03-06 DIAGNOSIS — D2360 Other benign neoplasm of skin of unspecified upper limb, including shoulder: Secondary | ICD-10-CM | POA: Diagnosis not present

## 2013-03-06 DIAGNOSIS — D485 Neoplasm of uncertain behavior of skin: Secondary | ICD-10-CM | POA: Diagnosis not present

## 2013-04-26 DIAGNOSIS — E785 Hyperlipidemia, unspecified: Secondary | ICD-10-CM | POA: Diagnosis not present

## 2013-04-26 DIAGNOSIS — R5381 Other malaise: Secondary | ICD-10-CM | POA: Diagnosis not present

## 2013-05-10 DIAGNOSIS — M899 Disorder of bone, unspecified: Secondary | ICD-10-CM | POA: Diagnosis not present

## 2013-05-10 DIAGNOSIS — M25819 Other specified joint disorders, unspecified shoulder: Secondary | ICD-10-CM | POA: Diagnosis not present

## 2013-06-18 ENCOUNTER — Other Ambulatory Visit: Payer: Self-pay

## 2013-06-18 DIAGNOSIS — F411 Generalized anxiety disorder: Secondary | ICD-10-CM | POA: Diagnosis not present

## 2013-06-18 DIAGNOSIS — Z1231 Encounter for screening mammogram for malignant neoplasm of breast: Secondary | ICD-10-CM

## 2013-07-26 ENCOUNTER — Ambulatory Visit
Admission: RE | Admit: 2013-07-26 | Discharge: 2013-07-26 | Disposition: A | Discharge status: Home / Self Care | Payer: Medicare Other | Source: Ambulatory Visit

## 2013-07-26 DIAGNOSIS — Z1231 Encounter for screening mammogram for malignant neoplasm of breast: Secondary | ICD-10-CM

## 2013-09-03 DIAGNOSIS — D485 Neoplasm of uncertain behavior of skin: Secondary | ICD-10-CM | POA: Diagnosis not present

## 2013-09-03 DIAGNOSIS — D239 Other benign neoplasm of skin, unspecified: Secondary | ICD-10-CM | POA: Diagnosis not present

## 2013-09-03 DIAGNOSIS — R21 Rash and other nonspecific skin eruption: Secondary | ICD-10-CM | POA: Diagnosis not present

## 2013-09-03 DIAGNOSIS — D2370 Other benign neoplasm of skin of unspecified lower limb, including hip: Secondary | ICD-10-CM | POA: Diagnosis not present

## 2013-09-03 DIAGNOSIS — D235 Other benign neoplasm of skin of trunk: Secondary | ICD-10-CM | POA: Diagnosis not present

## 2013-09-05 DIAGNOSIS — M719 Bursopathy, unspecified: Secondary | ICD-10-CM | POA: Diagnosis not present

## 2013-09-05 DIAGNOSIS — M67919 Unspecified disorder of synovium and tendon, unspecified shoulder: Secondary | ICD-10-CM | POA: Diagnosis not present

## 2013-11-06 DIAGNOSIS — H18839 Recurrent erosion of cornea, unspecified eye: Secondary | ICD-10-CM | POA: Diagnosis not present

## 2013-11-06 DIAGNOSIS — H43819 Vitreous degeneration, unspecified eye: Secondary | ICD-10-CM | POA: Diagnosis not present

## 2013-11-06 DIAGNOSIS — H43399 Other vitreous opacities, unspecified eye: Secondary | ICD-10-CM | POA: Diagnosis not present

## 2013-11-28 DIAGNOSIS — L723 Sebaceous cyst: Secondary | ICD-10-CM | POA: Diagnosis not present

## 2013-12-05 DIAGNOSIS — R5381 Other malaise: Secondary | ICD-10-CM | POA: Diagnosis not present

## 2013-12-05 DIAGNOSIS — R5383 Other fatigue: Secondary | ICD-10-CM | POA: Diagnosis not present

## 2013-12-05 DIAGNOSIS — Z01419 Encounter for gynecological examination (general) (routine) without abnormal findings: Secondary | ICD-10-CM | POA: Diagnosis not present

## 2013-12-05 DIAGNOSIS — Z124 Encounter for screening for malignant neoplasm of cervix: Secondary | ICD-10-CM | POA: Diagnosis not present

## 2013-12-10 ENCOUNTER — Telehealth: Payer: Self-pay | Admitting: Internal Medicine

## 2013-12-10 NOTE — Telephone Encounter (Signed)
New Message  Pt called states that she is waking up in the middle of the night from servere palpitations. She called is sleep walking. Pt also states that she is drenched in sweat when she wakes up and her body temperature changes often. Pt also states that she experiences dizziness. Offered pt an ov with Brook Edmisten for 12/11/2013 at 8:30am pt declined. Would rather speak Dr.Taylor. Please assist

## 2013-12-10 NOTE — Telephone Encounter (Signed)
Pt called because she states two nights ago the woke up from sleep she was  in the middle of the room sweating her heart racing. She felt confused . Pt has been very tired and SOB for no reason. The first time it happened several months ago. An appointment was made for pt with DR. Lovena Le on 12/13/13 at 4:15 PM pt aware.

## 2013-12-11 ENCOUNTER — Ambulatory Visit: Payer: Self-pay | Admitting: Cardiology

## 2013-12-13 ENCOUNTER — Ambulatory Visit (INDEPENDENT_AMBULATORY_CARE_PROVIDER_SITE_OTHER): Payer: Medicare Other | Admitting: Internal Medicine

## 2013-12-13 ENCOUNTER — Encounter: Payer: Self-pay | Admitting: Internal Medicine

## 2013-12-13 VITALS — BP 110/72 | HR 74 | Ht 63.5 in | Wt 120.0 lb

## 2013-12-13 DIAGNOSIS — R55 Syncope and collapse: Secondary | ICD-10-CM | POA: Diagnosis not present

## 2013-12-13 DIAGNOSIS — R002 Palpitations: Secondary | ICD-10-CM

## 2013-12-13 MED ORDER — MIDODRINE HCL 2.5 MG PO TABS: ORAL_TABLET | ORAL | Status: DC

## 2013-12-13 NOTE — Patient Instructions (Signed)
Your physician recommends that you schedule a follow-up appointment as needed  Your physician has recommended you make the following change in your medication:  1) Midodrine 2.5mg  twice daily and may take 2 additional daily if needed ---do not exceed 4 in a day

## 2013-12-13 NOTE — Progress Notes (Signed)
HPI Ms. Bell returns today for followup. 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 She continues to be under increased stress as a single parent. She has had 2-4 episodes of syncope a year for the past couple of years. Previously she had been under increased stress when she was undergoing divorce proceedings. She has had multiple other episodes associated with near syncope. She has learned to lie down quickly when she feels like something is going wrong or when she might be about to pass out. She has not injured herself. Previously she tried florinef but developed HTN. Allergies  Allergen Reactions  . Latex Itching and Swelling     Current Outpatient Prescriptions  Medication Sig Dispense Refill  . BIOTIN PO Take 500 mcg by mouth daily.      . cyclobenzaprine (FLEXERIL) 5 MG tablet Take 5 mg by mouth 3 (three) times daily as needed.        Marland Kitchen HYDROcodone-acetaminophen (VICODIN) 5-500 MG per tablet Take 1 tablet by mouth every 6 (six) hours as needed.        Marland Kitchen ibuprofen (IBU) 800 MG tablet Take 800 mg by mouth every 6 (six) hours as needed.       Marland Kitchen LYSINE PO Take 2 tablets by mouth daily.      . Multiple Vitamins-Minerals (VITAMINS/MINERALS PO) Take 1 capsule by mouth daily.       . midodrine (PROAMATINE) 2.5 MG tablet Take 1 tablet twice daily and may take an additional 2 per day  Not to exceed 4 in a 24 hour  60 tablet  6   No current facility-administered medications for this visit.     Past Medical History  Diagnosis Date  . Chronic arthritis   . Syncope and collapse   . Dizzy spells   . Gastritis   . Hiatal hernia   . Esophagitis   . Anxiety     ROS:   All systems reviewed and negative except as noted in the HPI.   Past Surgical History  Procedure Laterality Date  . Shoulder surgery      right  . Knee surgery      left     Family History  Problem Relation Age of Onset  . Kidney  disease      family hx of  . Hypertension      family hx of  . Diabetes      family hx of  . Liver disease      family hx of     History   Social History  . Marital Status: Legally Separated    Spouse Name: N/A    Number of Children: N/A  . Years of Education: N/A   Occupational History  . Not on file.   Social History Main Topics  . Smoking status: Never Smoker   . Smokeless tobacco: Not on file  . Alcohol Use: Not on file  . Drug Use: Not on file  . Sexual Activity: Not on file   Other Topics Concern  . Not on file   Social History Narrative  . No narrative on file     BP 110/72  Pulse 74  Ht 5' 3.5" (1.613 m)  Wt 120 lb (54.432 kg)  BMI 20.92 kg/m2  Physical Exam:  Well appearing 43 yo woman, NAD HEENT: Unremarkable Neck:  No JVD, no thyromegally Back:  No CVA tenderness Lungs:  Clear with no wheezes HEART:  Regular rate rhythm, no murmurs, no rubs, no clicks Abd:  soft, positive bowel sounds, no organomegally, no rebound, no guarding Ext:  2 plus pulses, no edema, no cyanosis, no clubbing Skin:  No rashes no nodules Neuro:  CN II through XII intact, motor grossly intact  EKG - NSR   Assess/Plan:

## 2013-12-13 NOTE — Assessment & Plan Note (Signed)
She continues to have breakthroughs of syncope, typically related to stress. I have asked the patient to try midodrine. In addition, she will avoid caffeine and ETOH and try to maintain a high sodium diet.

## 2014-02-05 DIAGNOSIS — K900 Celiac disease: Secondary | ICD-10-CM | POA: Diagnosis not present

## 2014-03-06 DIAGNOSIS — D235 Other benign neoplasm of skin of trunk: Secondary | ICD-10-CM | POA: Diagnosis not present

## 2014-03-06 DIAGNOSIS — D239 Other benign neoplasm of skin, unspecified: Secondary | ICD-10-CM | POA: Diagnosis not present

## 2014-03-06 DIAGNOSIS — D234 Other benign neoplasm of skin of scalp and neck: Secondary | ICD-10-CM | POA: Diagnosis not present

## 2014-03-06 DIAGNOSIS — D485 Neoplasm of uncertain behavior of skin: Secondary | ICD-10-CM | POA: Diagnosis not present

## 2014-03-14 DIAGNOSIS — M25519 Pain in unspecified shoulder: Secondary | ICD-10-CM | POA: Diagnosis not present

## 2014-03-21 DIAGNOSIS — M25519 Pain in unspecified shoulder: Secondary | ICD-10-CM | POA: Diagnosis not present

## 2014-03-25 DIAGNOSIS — M25519 Pain in unspecified shoulder: Secondary | ICD-10-CM | POA: Diagnosis not present

## 2014-03-29 DIAGNOSIS — M25519 Pain in unspecified shoulder: Secondary | ICD-10-CM | POA: Diagnosis not present

## 2014-04-01 DIAGNOSIS — M25519 Pain in unspecified shoulder: Secondary | ICD-10-CM | POA: Diagnosis not present

## 2014-04-16 DIAGNOSIS — M25511 Pain in right shoulder: Secondary | ICD-10-CM | POA: Diagnosis not present

## 2014-04-18 DIAGNOSIS — R350 Frequency of micturition: Secondary | ICD-10-CM | POA: Diagnosis not present

## 2014-04-19 DIAGNOSIS — R300 Dysuria: Secondary | ICD-10-CM | POA: Diagnosis not present

## 2014-04-19 DIAGNOSIS — M25511 Pain in right shoulder: Secondary | ICD-10-CM | POA: Diagnosis not present

## 2014-04-23 DIAGNOSIS — M25511 Pain in right shoulder: Secondary | ICD-10-CM | POA: Diagnosis not present

## 2014-04-26 DIAGNOSIS — M25511 Pain in right shoulder: Secondary | ICD-10-CM | POA: Diagnosis not present

## 2014-05-01 DIAGNOSIS — M25511 Pain in right shoulder: Secondary | ICD-10-CM | POA: Diagnosis not present

## 2014-05-03 DIAGNOSIS — M25511 Pain in right shoulder: Secondary | ICD-10-CM | POA: Diagnosis not present

## 2014-05-07 DIAGNOSIS — M25511 Pain in right shoulder: Secondary | ICD-10-CM | POA: Diagnosis not present

## 2014-05-16 DIAGNOSIS — M25511 Pain in right shoulder: Secondary | ICD-10-CM | POA: Diagnosis not present

## 2014-05-21 DIAGNOSIS — M25511 Pain in right shoulder: Secondary | ICD-10-CM | POA: Diagnosis not present

## 2014-05-24 DIAGNOSIS — M25511 Pain in right shoulder: Secondary | ICD-10-CM | POA: Diagnosis not present

## 2014-05-28 DIAGNOSIS — M25511 Pain in right shoulder: Secondary | ICD-10-CM | POA: Diagnosis not present

## 2014-05-29 ENCOUNTER — Other Ambulatory Visit: Payer: Self-pay | Admitting: Physician Assistant

## 2014-05-29 DIAGNOSIS — M719 Bursopathy, unspecified: Secondary | ICD-10-CM

## 2014-06-04 DIAGNOSIS — M25511 Pain in right shoulder: Secondary | ICD-10-CM | POA: Diagnosis not present

## 2014-06-06 DIAGNOSIS — M25511 Pain in right shoulder: Secondary | ICD-10-CM | POA: Diagnosis not present

## 2014-06-11 DIAGNOSIS — M25511 Pain in right shoulder: Secondary | ICD-10-CM | POA: Diagnosis not present

## 2014-06-14 ENCOUNTER — Other Ambulatory Visit: Payer: Self-pay

## 2014-06-19 ENCOUNTER — Other Ambulatory Visit: Payer: Self-pay

## 2014-06-19 DIAGNOSIS — Z1231 Encounter for screening mammogram for malignant neoplasm of breast: Secondary | ICD-10-CM

## 2014-06-26 DIAGNOSIS — M25511 Pain in right shoulder: Secondary | ICD-10-CM | POA: Diagnosis not present

## 2014-07-04 DIAGNOSIS — M25511 Pain in right shoulder: Secondary | ICD-10-CM | POA: Diagnosis not present

## 2014-07-29 ENCOUNTER — Ambulatory Visit
Admission: RE | Admit: 2014-07-29 | Discharge: 2014-07-29 | Disposition: A | Discharge status: Home / Self Care | Payer: Medicare Other | Source: Ambulatory Visit

## 2014-07-29 ENCOUNTER — Ambulatory Visit: Payer: Self-pay

## 2014-07-29 DIAGNOSIS — Z1231 Encounter for screening mammogram for malignant neoplasm of breast: Secondary | ICD-10-CM | POA: Diagnosis not present

## 2014-07-30 ENCOUNTER — Other Ambulatory Visit: Payer: Self-pay | Admitting: Obstetrics and Gynecology

## 2014-07-30 DIAGNOSIS — R928 Other abnormal and inconclusive findings on diagnostic imaging of breast: Secondary | ICD-10-CM

## 2014-07-30 DIAGNOSIS — G5681 Other specified mononeuropathies of right upper limb: Secondary | ICD-10-CM | POA: Diagnosis not present

## 2014-08-06 ENCOUNTER — Ambulatory Visit
Admission: RE | Admit: 2014-08-06 | Discharge: 2014-08-06 | Disposition: A | Discharge status: Home / Self Care | Payer: Medicare Other | Source: Ambulatory Visit | Attending: Obstetrics and Gynecology | Admitting: Obstetrics and Gynecology

## 2014-08-06 DIAGNOSIS — R928 Other abnormal and inconclusive findings on diagnostic imaging of breast: Secondary | ICD-10-CM

## 2014-08-06 DIAGNOSIS — Z803 Family history of malignant neoplasm of breast: Secondary | ICD-10-CM | POA: Diagnosis not present

## 2014-08-06 DIAGNOSIS — R922 Inconclusive mammogram: Secondary | ICD-10-CM | POA: Diagnosis not present

## 2014-09-05 DIAGNOSIS — D2261 Melanocytic nevi of right upper limb, including shoulder: Secondary | ICD-10-CM | POA: Diagnosis not present

## 2014-09-05 DIAGNOSIS — D2271 Melanocytic nevi of right lower limb, including hip: Secondary | ICD-10-CM | POA: Diagnosis not present

## 2014-09-05 DIAGNOSIS — D224 Melanocytic nevi of scalp and neck: Secondary | ICD-10-CM | POA: Diagnosis not present

## 2014-09-05 DIAGNOSIS — D2262 Melanocytic nevi of left upper limb, including shoulder: Secondary | ICD-10-CM | POA: Diagnosis not present

## 2014-09-05 DIAGNOSIS — D2272 Melanocytic nevi of left lower limb, including hip: Secondary | ICD-10-CM | POA: Diagnosis not present

## 2014-09-05 DIAGNOSIS — L821 Other seborrheic keratosis: Secondary | ICD-10-CM | POA: Diagnosis not present

## 2014-09-05 DIAGNOSIS — D225 Melanocytic nevi of trunk: Secondary | ICD-10-CM | POA: Diagnosis not present

## 2014-11-07 DIAGNOSIS — Z113 Encounter for screening for infections with a predominantly sexual mode of transmission: Secondary | ICD-10-CM | POA: Diagnosis not present

## 2014-11-07 DIAGNOSIS — Z1321 Encounter for screening for nutritional disorder: Secondary | ICD-10-CM | POA: Diagnosis not present

## 2014-11-07 DIAGNOSIS — Z130 Encounter for screening for diseases of the blood and blood-forming organs and certain disorders involving the immune mechanism: Secondary | ICD-10-CM | POA: Diagnosis not present

## 2014-11-07 DIAGNOSIS — Z13220 Encounter for screening for lipoid disorders: Secondary | ICD-10-CM | POA: Diagnosis not present

## 2014-11-07 DIAGNOSIS — Z1329 Encounter for screening for other suspected endocrine disorder: Secondary | ICD-10-CM | POA: Diagnosis not present

## 2014-11-07 DIAGNOSIS — Z0000 Encounter for general adult medical examination without abnormal findings: Secondary | ICD-10-CM | POA: Diagnosis not present

## 2014-11-07 DIAGNOSIS — R946 Abnormal results of thyroid function studies: Secondary | ICD-10-CM | POA: Diagnosis not present

## 2014-11-21 DIAGNOSIS — Z124 Encounter for screening for malignant neoplasm of cervix: Secondary | ICD-10-CM | POA: Diagnosis not present

## 2014-11-21 DIAGNOSIS — Z01419 Encounter for gynecological examination (general) (routine) without abnormal findings: Secondary | ICD-10-CM | POA: Diagnosis not present

## 2014-12-31 DIAGNOSIS — H18832 Recurrent erosion of cornea, left eye: Secondary | ICD-10-CM | POA: Diagnosis not present

## 2015-03-04 DIAGNOSIS — H04123 Dry eye syndrome of bilateral lacrimal glands: Secondary | ICD-10-CM | POA: Diagnosis not present

## 2015-03-04 DIAGNOSIS — H18833 Recurrent erosion of cornea, bilateral: Secondary | ICD-10-CM | POA: Diagnosis not present

## 2015-03-06 DIAGNOSIS — D485 Neoplasm of uncertain behavior of skin: Secondary | ICD-10-CM | POA: Diagnosis not present

## 2015-03-06 DIAGNOSIS — D2262 Melanocytic nevi of left upper limb, including shoulder: Secondary | ICD-10-CM | POA: Diagnosis not present

## 2015-03-06 DIAGNOSIS — D2271 Melanocytic nevi of right lower limb, including hip: Secondary | ICD-10-CM | POA: Diagnosis not present

## 2015-03-06 DIAGNOSIS — D225 Melanocytic nevi of trunk: Secondary | ICD-10-CM | POA: Diagnosis not present

## 2015-03-06 DIAGNOSIS — D2272 Melanocytic nevi of left lower limb, including hip: Secondary | ICD-10-CM | POA: Diagnosis not present

## 2015-03-06 DIAGNOSIS — D2261 Melanocytic nevi of right upper limb, including shoulder: Secondary | ICD-10-CM | POA: Diagnosis not present

## 2015-04-21 DIAGNOSIS — H18833 Recurrent erosion of cornea, bilateral: Secondary | ICD-10-CM | POA: Diagnosis not present

## 2015-05-04 DIAGNOSIS — Z882 Allergy status to sulfonamides status: Secondary | ICD-10-CM | POA: Diagnosis not present

## 2015-05-04 DIAGNOSIS — R112 Nausea with vomiting, unspecified: Secondary | ICD-10-CM | POA: Diagnosis not present

## 2015-05-04 DIAGNOSIS — Z91040 Latex allergy status: Secondary | ICD-10-CM | POA: Diagnosis not present

## 2015-05-04 DIAGNOSIS — Z91018 Allergy to other foods: Secondary | ICD-10-CM | POA: Diagnosis not present

## 2015-05-04 DIAGNOSIS — R55 Syncope and collapse: Secondary | ICD-10-CM | POA: Diagnosis not present

## 2015-05-04 DIAGNOSIS — H16002 Unspecified corneal ulcer, left eye: Secondary | ICD-10-CM | POA: Diagnosis not present

## 2015-05-05 DIAGNOSIS — H18832 Recurrent erosion of cornea, left eye: Secondary | ICD-10-CM | POA: Diagnosis not present

## 2015-06-19 DIAGNOSIS — H18833 Recurrent erosion of cornea, bilateral: Secondary | ICD-10-CM | POA: Diagnosis not present

## 2015-07-08 ENCOUNTER — Other Ambulatory Visit: Payer: Self-pay

## 2015-07-08 DIAGNOSIS — Z1231 Encounter for screening mammogram for malignant neoplasm of breast: Secondary | ICD-10-CM

## 2015-07-10 ENCOUNTER — Other Ambulatory Visit: Payer: Self-pay | Admitting: Obstetrics and Gynecology

## 2015-07-10 DIAGNOSIS — H18832 Recurrent erosion of cornea, left eye: Secondary | ICD-10-CM | POA: Diagnosis not present

## 2015-07-10 DIAGNOSIS — H04123 Dry eye syndrome of bilateral lacrimal glands: Secondary | ICD-10-CM | POA: Diagnosis not present

## 2015-07-10 DIAGNOSIS — Z803 Family history of malignant neoplasm of breast: Secondary | ICD-10-CM

## 2015-07-16 DIAGNOSIS — H18832 Recurrent erosion of cornea, left eye: Secondary | ICD-10-CM | POA: Diagnosis not present

## 2015-07-31 ENCOUNTER — Ambulatory Visit
Admission: RE | Admit: 2015-07-31 | Discharge: 2015-07-31 | Disposition: A | Discharge status: Home / Self Care | Payer: Medicare Other | Source: Ambulatory Visit

## 2015-07-31 DIAGNOSIS — Z1231 Encounter for screening mammogram for malignant neoplasm of breast: Secondary | ICD-10-CM

## 2015-08-28 DIAGNOSIS — M25511 Pain in right shoulder: Secondary | ICD-10-CM | POA: Diagnosis not present

## 2015-08-28 DIAGNOSIS — G8929 Other chronic pain: Secondary | ICD-10-CM | POA: Diagnosis not present

## 2015-12-02 DIAGNOSIS — D2271 Melanocytic nevi of right lower limb, including hip: Secondary | ICD-10-CM | POA: Diagnosis not present

## 2015-12-02 DIAGNOSIS — D2261 Melanocytic nevi of right upper limb, including shoulder: Secondary | ICD-10-CM | POA: Diagnosis not present

## 2015-12-02 DIAGNOSIS — D2262 Melanocytic nevi of left upper limb, including shoulder: Secondary | ICD-10-CM | POA: Diagnosis not present

## 2015-12-02 DIAGNOSIS — D485 Neoplasm of uncertain behavior of skin: Secondary | ICD-10-CM | POA: Diagnosis not present

## 2015-12-02 DIAGNOSIS — D2239 Melanocytic nevi of other parts of face: Secondary | ICD-10-CM | POA: Diagnosis not present

## 2015-12-02 DIAGNOSIS — D225 Melanocytic nevi of trunk: Secondary | ICD-10-CM | POA: Diagnosis not present

## 2015-12-02 DIAGNOSIS — D2272 Melanocytic nevi of left lower limb, including hip: Secondary | ICD-10-CM | POA: Diagnosis not present

## 2015-12-02 DIAGNOSIS — D224 Melanocytic nevi of scalp and neck: Secondary | ICD-10-CM | POA: Diagnosis not present

## 2015-12-23 DIAGNOSIS — Z13220 Encounter for screening for lipoid disorders: Secondary | ICD-10-CM | POA: Diagnosis not present

## 2015-12-23 DIAGNOSIS — Z130 Encounter for screening for diseases of the blood and blood-forming organs and certain disorders involving the immune mechanism: Secondary | ICD-10-CM | POA: Diagnosis not present

## 2015-12-23 DIAGNOSIS — Z0000 Encounter for general adult medical examination without abnormal findings: Secondary | ICD-10-CM | POA: Diagnosis not present

## 2015-12-23 DIAGNOSIS — Z1329 Encounter for screening for other suspected endocrine disorder: Secondary | ICD-10-CM | POA: Diagnosis not present

## 2016-01-16 DIAGNOSIS — Z124 Encounter for screening for malignant neoplasm of cervix: Secondary | ICD-10-CM | POA: Diagnosis not present

## 2016-01-16 DIAGNOSIS — Z01419 Encounter for gynecological examination (general) (routine) without abnormal findings: Secondary | ICD-10-CM | POA: Diagnosis not present

## 2016-02-05 DIAGNOSIS — H52203 Unspecified astigmatism, bilateral: Secondary | ICD-10-CM | POA: Diagnosis not present

## 2016-02-05 DIAGNOSIS — H18833 Recurrent erosion of cornea, bilateral: Secondary | ICD-10-CM | POA: Diagnosis not present

## 2016-02-05 DIAGNOSIS — H43813 Vitreous degeneration, bilateral: Secondary | ICD-10-CM | POA: Diagnosis not present

## 2016-02-05 DIAGNOSIS — H43393 Other vitreous opacities, bilateral: Secondary | ICD-10-CM | POA: Diagnosis not present

## 2016-02-06 DIAGNOSIS — K588 Other irritable bowel syndrome: Secondary | ICD-10-CM | POA: Diagnosis not present

## 2016-02-06 DIAGNOSIS — K625 Hemorrhage of anus and rectum: Secondary | ICD-10-CM | POA: Diagnosis not present

## 2016-02-06 DIAGNOSIS — K219 Gastro-esophageal reflux disease without esophagitis: Secondary | ICD-10-CM | POA: Diagnosis not present

## 2016-03-10 DIAGNOSIS — R1031 Right lower quadrant pain: Secondary | ICD-10-CM | POA: Diagnosis not present

## 2016-05-21 DIAGNOSIS — R1031 Right lower quadrant pain: Secondary | ICD-10-CM | POA: Diagnosis not present

## 2016-05-25 DIAGNOSIS — R1031 Right lower quadrant pain: Secondary | ICD-10-CM | POA: Diagnosis not present

## 2016-05-25 DIAGNOSIS — R140 Abdominal distension (gaseous): Secondary | ICD-10-CM | POA: Diagnosis not present

## 2016-05-31 DIAGNOSIS — D2261 Melanocytic nevi of right upper limb, including shoulder: Secondary | ICD-10-CM | POA: Diagnosis not present

## 2016-05-31 DIAGNOSIS — D2272 Melanocytic nevi of left lower limb, including hip: Secondary | ICD-10-CM | POA: Diagnosis not present

## 2016-05-31 DIAGNOSIS — D225 Melanocytic nevi of trunk: Secondary | ICD-10-CM | POA: Diagnosis not present

## 2016-05-31 DIAGNOSIS — D2271 Melanocytic nevi of right lower limb, including hip: Secondary | ICD-10-CM | POA: Diagnosis not present

## 2016-05-31 DIAGNOSIS — D2262 Melanocytic nevi of left upper limb, including shoulder: Secondary | ICD-10-CM | POA: Diagnosis not present

## 2016-06-15 DIAGNOSIS — G8929 Other chronic pain: Secondary | ICD-10-CM | POA: Diagnosis not present

## 2016-06-15 DIAGNOSIS — M25511 Pain in right shoulder: Secondary | ICD-10-CM | POA: Diagnosis not present

## 2016-06-23 ENCOUNTER — Other Ambulatory Visit: Payer: Self-pay | Admitting: Allergy and Immunology

## 2016-06-23 NOTE — Telephone Encounter (Signed)
She called saying that she was given an Epi pen here in the office and it has expired. She was also given a prescription for one from the pharmacy but it has expired also. She wants to know if she can get another one sent in and also can she get a coupon to use? If you have any questions please call you have any questions please call [phone].  She uses [facility] on [address]

## 2016-06-24 MED ORDER — EPINEPHRINE 0.3 MG/0.3ML IJ SOAJ: 0.3000 mg | Freq: Once | INTRAMUSCULAR | 2 refills | Status: AC

## 2016-06-24 NOTE — Telephone Encounter (Signed)
Spoke with pt, refilled Epi-Pen. Also mailed a coupon card. Pt aware.

## 2016-06-30 ENCOUNTER — Other Ambulatory Visit: Payer: Self-pay | Admitting: Obstetrics and Gynecology

## 2016-06-30 DIAGNOSIS — Z1231 Encounter for screening mammogram for malignant neoplasm of breast: Secondary | ICD-10-CM

## 2016-08-02 ENCOUNTER — Ambulatory Visit
Admission: RE | Admit: 2016-08-02 | Discharge: 2016-08-02 | Disposition: A | Discharge status: Home / Self Care | Payer: Medicare Other | Source: Ambulatory Visit | Attending: Obstetrics and Gynecology | Admitting: Obstetrics and Gynecology

## 2016-08-02 DIAGNOSIS — Z1231 Encounter for screening mammogram for malignant neoplasm of breast: Secondary | ICD-10-CM

## 2016-08-05 ENCOUNTER — Telehealth: Payer: Self-pay | Admitting: Allergy and Immunology

## 2016-08-05 NOTE — Telephone Encounter (Signed)
Returned call. I was told by Dr. Nelva Bush that she signed for samples yesterday when our rep came in. Advised pt that it will be another few weeks before those are mailed in.

## 2016-08-05 NOTE — Telephone Encounter (Signed)
Patient called asking for Amber, about an Epi Pen from a drug rep.

## 2016-08-20 DIAGNOSIS — Z6820 Body mass index (BMI) 20.0-20.9, adult: Secondary | ICD-10-CM | POA: Diagnosis not present

## 2016-08-20 DIAGNOSIS — R0600 Dyspnea, unspecified: Secondary | ICD-10-CM | POA: Diagnosis not present

## 2016-08-20 DIAGNOSIS — R5383 Other fatigue: Secondary | ICD-10-CM | POA: Diagnosis not present

## 2016-08-20 DIAGNOSIS — J101 Influenza due to other identified influenza virus with other respiratory manifestations: Secondary | ICD-10-CM | POA: Diagnosis not present

## 2016-08-20 DIAGNOSIS — J111 Influenza due to unidentified influenza virus with other respiratory manifestations: Secondary | ICD-10-CM | POA: Diagnosis not present

## 2016-08-23 DIAGNOSIS — R0600 Dyspnea, unspecified: Secondary | ICD-10-CM | POA: Diagnosis not present

## 2016-08-23 DIAGNOSIS — J101 Influenza due to other identified influenza virus with other respiratory manifestations: Secondary | ICD-10-CM | POA: Diagnosis not present

## 2016-09-01 ENCOUNTER — Telehealth: Payer: Self-pay | Admitting: Allergy and Immunology

## 2016-09-01 NOTE — Telephone Encounter (Signed)
I have informed patient that we have a sample ready for her to pick up. Will educate once she arrives.

## 2016-09-01 NOTE — Telephone Encounter (Signed)
Pt called about the epi-pen that we were suppose safe for her but dont have it. Can we give her Auvi-Q instead.

## 2016-09-01 NOTE — Telephone Encounter (Signed)
Auvi-Q would be fine

## 2016-09-07 NOTE — Telephone Encounter (Signed)
EpiPen sample is ready for pick up. Patient has been informed and will pick up this morning.

## 2016-09-08 DIAGNOSIS — D649 Anemia, unspecified: Secondary | ICD-10-CM | POA: Diagnosis present

## 2016-09-08 DIAGNOSIS — Z6820 Body mass index (BMI) 20.0-20.9, adult: Secondary | ICD-10-CM | POA: Diagnosis not present

## 2016-09-08 DIAGNOSIS — J449 Chronic obstructive pulmonary disease, unspecified: Secondary | ICD-10-CM | POA: Diagnosis not present

## 2016-09-08 DIAGNOSIS — R05 Cough: Secondary | ICD-10-CM | POA: Diagnosis not present

## 2016-09-08 DIAGNOSIS — D6489 Other specified anemias: Secondary | ICD-10-CM | POA: Diagnosis not present

## 2016-09-21 ENCOUNTER — Ambulatory Visit: Payer: Self-pay | Admitting: Allergy and Immunology

## 2016-10-05 ENCOUNTER — Ambulatory Visit (INDEPENDENT_AMBULATORY_CARE_PROVIDER_SITE_OTHER): Payer: Medicare Other | Admitting: Allergy and Immunology

## 2016-10-05 ENCOUNTER — Encounter: Payer: Self-pay | Admitting: Allergy and Immunology

## 2016-10-05 VITALS — BP 112/78 | HR 66 | Resp 18 | Ht 62.8 in | Wt 118.0 lb

## 2016-10-05 DIAGNOSIS — K219 Gastro-esophageal reflux disease without esophagitis: Secondary | ICD-10-CM | POA: Diagnosis not present

## 2016-10-05 DIAGNOSIS — J4520 Mild intermittent asthma, uncomplicated: Secondary | ICD-10-CM | POA: Diagnosis not present

## 2016-10-05 DIAGNOSIS — J3089 Other allergic rhinitis: Secondary | ICD-10-CM | POA: Diagnosis not present

## 2016-10-05 DIAGNOSIS — T781XXD Other adverse food reactions, not elsewhere classified, subsequent encounter: Secondary | ICD-10-CM

## 2016-10-05 MED ORDER — FLUTICASONE FUROATE 100 MCG/ACT IN AEPB: 1.0000 | INHALATION_SPRAY | Freq: Every day | RESPIRATORY_TRACT | 5 refills | Status: DC

## 2016-10-05 MED ORDER — ALBUTEROL SULFATE HFA 108 (90 BASE) MCG/ACT IN AERS: INHALATION_SPRAY | RESPIRATORY_TRACT | 1 refills | Status: DC

## 2016-10-05 NOTE — Progress Notes (Signed)
Follow-up Note  Referring Provider: No ref. provider found Primary Provider: Leanna Battles (Inactive) Date of Office Visit: 10/05/2016  Subjective:   Tracey Proctor (DOB: [birth year]) is a 46 y.o. female who returns to the [facility] on 10/05/2016 in re-evaluation of the following:  HPI: Caridad presents to this clinic in evaluation of respiratory tract symptoms. I've not seen her in his clinic since August 2015.  Many years ago she did have an issue with intermittent asthma and allergic rhinoconjunctivitis and also oral allergy syndrome. However, for many years she had very little problems with her upper or lower respiratory tract and rarely uses any type of preventative medication and rarely uses a rescue medication and as long she remained away from apples and peaches and cherries and plums she did quite well regarding her oral allergy syndrome.  Unfortunately, she contracted influenza with a very high fever and prolonged respiratory tract issues involving both her head and chest requiring the administration of 2 systemic steroids about 4 weeks ago. She is much better at this point in time but she believes that she is "just not right" regarding her lungs. She still has this slight heaviness in her chest and a slight cough and still has some intermittent issues with throat clearing. She did have problems with reflux during that whole event and she restarted herself on ranitidine. She does not have any chest pain and she does not have any ugly sputum production and she has no ugly nasal discharge or anosmia or headaches or recurrent fevers. She apparently had a chest x-ray performed during her diagnosis and treatment of influenza which apparently identified no significant abnormality.  Allergies as of 10/05/2016      Reactions   Other Anaphylaxis   Peaches, Cherries, Plums, Almonds, Apples   Sulfamethoxazole Rash, Swelling   Latex Itching, Swelling      Medication List        albuterol 108 (90 Base) MCG/ACT inhaler Commonly known as:  PROVENTIL HFA Inhale two puffs every four to six hours as needed for cough or wheeze.   EPINEPHrine 0.3 mg/0.3 mL Soaj injection Commonly known as:  EPI-PEN Inject into the muscle.   Fluticasone Furoate 100 MCG/ACT Aepb Commonly known as:  ARNUITY ELLIPTA Inhale 1 Dose into the lungs daily. Rinse, gargle, and spit after use.   LYSINE PO Take 2 tablets by mouth daily.   VITAMIN B 12 PO Take by mouth.   VITAMIN C PO Take by mouth.   VITAMINS/MINERALS PO Take 1 capsule by mouth daily.       Past Medical History:  Diagnosis Date  . Anxiety   . Asthma   . Chronic arthritis   . Dizzy spells   . Esophagitis   . Food allergy    Cherries, Peaches, Plums, Apples, Almonds ( Foods related to the [place] tree)  . Gastritis   . Hiatal hernia   . Syncope and collapse     Past Surgical History:  Procedure Laterality Date  . ADENOIDECTOMY    . KNEE SURGERY     left  . SHOULDER SURGERY     right  . TONSILLECTOMY      Review of systems negative except as noted in HPI / PMHx or noted below:  Review of Systems  Constitutional: Negative.   HENT: Negative.   Eyes: Negative.   Respiratory: Negative.   Cardiovascular: Negative.   Gastrointestinal: Negative.   Genitourinary: Negative.   Musculoskeletal: Negative.   Skin:  Negative.   Neurological: Negative.   Endo/Heme/Allergies: Negative.   Psychiatric/Behavioral: Negative.      Objective:   Vitals:   10/05/16 1035  BP: 112/78  Pulse: 66  Resp: 18   Height: 5' 2.8" (159.5 cm)  Weight: 118 lb (53.5 kg)   Physical Exam  Constitutional: She is well-developed, well-nourished, and in no distress.  HENT:  Head: Normocephalic.  Right Ear: Tympanic membrane, external ear and ear canal normal.  Left Ear: Tympanic membrane, external ear and ear canal normal.  Nose: Nose normal. No mucosal edema or rhinorrhea.  Mouth/Throat: Uvula is midline,  oropharynx is clear and moist and mucous membranes are normal. No oropharyngeal exudate.  Eyes: Conjunctivae are normal.  Neck: Trachea normal. No tracheal tenderness present. No tracheal deviation present. No thyromegaly present.  Cardiovascular: Normal rate, regular rhythm, S1 normal, S2 normal and normal heart sounds.   No murmur heard. Pulmonary/Chest: Breath sounds normal. No stridor. No respiratory distress. She has no wheezes. She has no rales.  Musculoskeletal: She exhibits no edema.  Lymphadenopathy:       Head (right side): No tonsillar adenopathy present.       Head (left side): No tonsillar adenopathy present.    She has no cervical adenopathy.  Neurological: She is alert. Gait normal.  Skin: No rash noted. She is not diaphoretic. No erythema. Nails show no clubbing.  Psychiatric: Mood and affect normal.    Diagnostics:    Spirometry was performed and demonstrated an FEV1 of 2.89 at 104 % of predicted.  Assessment and Plan:   1. Asthma, mild intermittent, well-controlled   2. Other allergic rhinitis   3. LPRD (laryngopharyngeal reflux disease)   4. Pollen-food allergy, subsequent encounter     1. Arnuity 100 - one inhalation 1 time per day. Coupon.  2. Dexilant 60 - one tablet one time per day. Sample.   3. If needed:   A. EpiPen, Benadryl, M.D./ER evaluation for allergic reaction   B. OTC antihistamine  C. Proventil HFA or similar 2 inhalations every 4-6 hours  4. Further evaluation?  5. Arnuity and Dexilant use for 1 month?  6. Return to clinic in 1 year or earlier if problem  I will assume that Hether has some lingering inflammation of her respiratory tract since her recent episode of influenza and because of all the coughing that she had during that episode may have rolled over into a component of LPR and I will treat her with a combination of anti-inflammatory agents for lower respiratory tract and a proton pump inhibitor for her reflux. I will assume  that she'll do well over the course of the next several weeks and in fact I gave her samples today without a long-term prescription assuming that she will resolve all her problems and revert back to her previous functioning stay which was very good without the use of any specific medications.She will keep in contact with me noting her response.  Allena Katz, MD Allergy / [facility]ology [place]

## 2016-10-05 NOTE — Patient Instructions (Addendum)
  1. Arnuity 100 - one inhalation 1 time per day. Coupon.  2. Dexilant 60 - one tablet one time per day. Sample.   3. If needed:   A. EpiPen, Benadryl, M.D./ER evaluation for allergic reaction   B. OTC antihistamine  C. Proventil HFA or similar 2 inhalations every 4-6 hours  4. Further evaluation?  5. Arnuity and Dexilant use for 1 month?  6. Return to clinic in 1 year or earlier if problem

## 2016-10-19 ENCOUNTER — Telehealth: Payer: Self-pay | Admitting: Allergy and Immunology

## 2016-10-19 MED ORDER — FLUCONAZOLE 150 MG PO TABS: 150.0000 mg | ORAL_TABLET | Freq: Every day | ORAL | 0 refills | Status: AC

## 2016-10-19 NOTE — Telephone Encounter (Signed)
Patient was seen by KOZLOW a few weeks ago Patient was given samples of an inhaler and acid reflux medication Patient is NOT feeling any better Was told that her lungs have been damaged Patient says she hurts all the time as if "dust is constantly in her lungs and throat"" What can she do?? Any other alternative to medications??  Please call pt to answer any questions

## 2016-10-19 NOTE — Telephone Encounter (Signed)
Please advise 

## 2016-10-19 NOTE — Telephone Encounter (Signed)
Patient informed and advised of medication.

## 2016-10-19 NOTE — Telephone Encounter (Signed)
Please inform patient that maybe she has developed a issue with fungal overgrowth with all the steroids that she is received. Have her use Diflucan 150 mg tablet once a day for 3 days only. This will get int her body and stay there for about 14 days or so.

## 2016-10-21 DIAGNOSIS — G4700 Insomnia, unspecified: Secondary | ICD-10-CM | POA: Diagnosis present

## 2016-10-21 DIAGNOSIS — Z6820 Body mass index (BMI) 20.0-20.9, adult: Secondary | ICD-10-CM | POA: Diagnosis not present

## 2016-10-21 DIAGNOSIS — G4709 Other insomnia: Secondary | ICD-10-CM | POA: Diagnosis not present

## 2016-10-21 DIAGNOSIS — R05 Cough: Secondary | ICD-10-CM | POA: Diagnosis not present

## 2016-10-21 DIAGNOSIS — R5383 Other fatigue: Secondary | ICD-10-CM | POA: Diagnosis not present

## 2016-10-21 DIAGNOSIS — F418 Other specified anxiety disorders: Secondary | ICD-10-CM | POA: Diagnosis not present

## 2016-10-21 DIAGNOSIS — R0609 Other forms of dyspnea: Secondary | ICD-10-CM | POA: Diagnosis not present

## 2016-10-21 DIAGNOSIS — K449 Diaphragmatic hernia without obstruction or gangrene: Secondary | ICD-10-CM | POA: Diagnosis not present

## 2016-10-22 ENCOUNTER — Other Ambulatory Visit: Payer: Self-pay | Admitting: Internal Medicine

## 2016-10-22 ENCOUNTER — Ambulatory Visit
Admission: RE | Admit: 2016-10-22 | Discharge: 2016-10-22 | Disposition: A | Discharge status: Home / Self Care | Payer: Medicare Other | Source: Ambulatory Visit | Attending: Internal Medicine | Admitting: Internal Medicine

## 2016-10-22 ENCOUNTER — Other Ambulatory Visit: Payer: Self-pay

## 2016-10-22 DIAGNOSIS — R079 Chest pain, unspecified: Secondary | ICD-10-CM | POA: Diagnosis not present

## 2016-10-22 DIAGNOSIS — R059 Cough, unspecified: Secondary | ICD-10-CM

## 2016-10-22 DIAGNOSIS — R0602 Shortness of breath: Secondary | ICD-10-CM

## 2016-10-22 DIAGNOSIS — R05 Cough: Secondary | ICD-10-CM

## 2016-10-22 DIAGNOSIS — R509 Fever, unspecified: Secondary | ICD-10-CM

## 2016-10-22 MED ORDER — IOPAMIDOL (ISOVUE-300) INJECTION 61%: 75.0000 mL | Freq: Once | INTRAVENOUS | Status: DC | PRN

## 2016-10-26 ENCOUNTER — Telehealth: Payer: Self-pay | Admitting: Allergy and Immunology

## 2016-10-26 NOTE — Telephone Encounter (Signed)
Patient is not feeling well - chest and lungs hurt DIFLUCAN did not work PCP did labs, CT with contrast, and xray- (([facility])) All normal X-RAY did show inflammation in both lungs  Patient is wondering if there is anything that can be done since the DIFLUCAN did not work

## 2016-10-26 NOTE — Telephone Encounter (Signed)
Please advise and thank you. 

## 2016-10-26 NOTE — Telephone Encounter (Signed)
Please inform patient that she may have developed a low-grade pleuritis secondary to her infection and to use ibuprofen 600 mg-800 mg 3 times per day and report back with response in several days

## 2016-10-26 NOTE — Telephone Encounter (Signed)
Left detailed message on patient's personal voicemail.  Told her to call back tomorrow with any questions.

## 2016-10-27 NOTE — Telephone Encounter (Signed)
Patient called this morning. I advised as Jarrett Soho had in her voicemail to patient.   Patient states that she had the CT scan ordered by her PCP, Dr. Philip Aspen. The results are in EPIC and she would like for you to review them. The 2 xrays are not in the system and were completed at her PCPs office. I am going to contact them and get the x-ray results sent here.   Patient stated that she is very frustrated because this has been ongoing for over a week and she feels as if she is going in circles if we do not have the results for you to look at.     Please advise. Patient has not been seen in office in quite some time.

## 2016-10-28 NOTE — Telephone Encounter (Signed)
Discussed with patient. She will try Symbicort 160 - 2 inhalations twice a day with a spacer for the next several weeks and report back with her response. As well, she will use ibuprofen several times per day for possible pleuritis.

## 2016-10-28 NOTE — Telephone Encounter (Signed)
Patient came by office and given 2 samples of Symbicort 160 mcg.

## 2016-10-29 ENCOUNTER — Telehealth: Payer: Self-pay | Admitting: Allergy and Immunology

## 2016-10-29 NOTE — Telephone Encounter (Signed)
Tracey Proctor called in and stated that the [facility] was working for her.  She did take some PREDNISONE for her shoulder that was hurting her.  Here PCP prescribed it for her shoulder.  She was wondering if you could prescribe her PREDNISOZE because when she took it for her shoulder it actually ended up clearing her lungs.  She uses [facility] on [address].

## 2016-11-01 ENCOUNTER — Institutional Professional Consult (permissible substitution): Payer: Self-pay | Admitting: Pulmonary Disease

## 2016-11-01 DIAGNOSIS — H18832 Recurrent erosion of cornea, left eye: Secondary | ICD-10-CM | POA: Diagnosis not present

## 2016-11-05 DIAGNOSIS — H18831 Recurrent erosion of cornea, right eye: Secondary | ICD-10-CM | POA: Diagnosis not present

## 2016-11-05 NOTE — Telephone Encounter (Signed)
Dr. Neldon Mc, I was wondering if you received this message?

## 2016-11-10 DIAGNOSIS — H18831 Recurrent erosion of cornea, right eye: Secondary | ICD-10-CM | POA: Diagnosis not present

## 2016-11-12 ENCOUNTER — Institutional Professional Consult (permissible substitution): Payer: Self-pay | Admitting: Pulmonary Disease

## 2016-11-24 DIAGNOSIS — D2261 Melanocytic nevi of right upper limb, including shoulder: Secondary | ICD-10-CM | POA: Diagnosis not present

## 2016-11-24 DIAGNOSIS — D2262 Melanocytic nevi of left upper limb, including shoulder: Secondary | ICD-10-CM | POA: Diagnosis not present

## 2016-11-24 DIAGNOSIS — D2272 Melanocytic nevi of left lower limb, including hip: Secondary | ICD-10-CM | POA: Diagnosis not present

## 2016-11-24 DIAGNOSIS — D224 Melanocytic nevi of scalp and neck: Secondary | ICD-10-CM | POA: Diagnosis not present

## 2016-11-24 DIAGNOSIS — D225 Melanocytic nevi of trunk: Secondary | ICD-10-CM | POA: Diagnosis not present

## 2016-11-24 DIAGNOSIS — D2271 Melanocytic nevi of right lower limb, including hip: Secondary | ICD-10-CM | POA: Diagnosis not present

## 2016-12-28 DIAGNOSIS — M25471 Effusion, right ankle: Secondary | ICD-10-CM | POA: Diagnosis not present

## 2016-12-28 DIAGNOSIS — M25571 Pain in right ankle and joints of right foot: Secondary | ICD-10-CM | POA: Diagnosis not present

## 2016-12-28 DIAGNOSIS — G8929 Other chronic pain: Secondary | ICD-10-CM | POA: Diagnosis not present

## 2016-12-29 DIAGNOSIS — M25471 Effusion, right ankle: Secondary | ICD-10-CM | POA: Diagnosis not present

## 2017-01-28 DIAGNOSIS — G8929 Other chronic pain: Secondary | ICD-10-CM | POA: Diagnosis not present

## 2017-01-28 DIAGNOSIS — Q6689 Other  specified congenital deformities of feet: Secondary | ICD-10-CM | POA: Diagnosis not present

## 2017-01-28 DIAGNOSIS — M25571 Pain in right ankle and joints of right foot: Secondary | ICD-10-CM | POA: Diagnosis not present

## 2017-02-10 DIAGNOSIS — Z124 Encounter for screening for malignant neoplasm of cervix: Secondary | ICD-10-CM | POA: Diagnosis not present

## 2017-02-10 DIAGNOSIS — Z01419 Encounter for gynecological examination (general) (routine) without abnormal findings: Secondary | ICD-10-CM | POA: Diagnosis not present

## 2017-04-26 DIAGNOSIS — M25511 Pain in right shoulder: Secondary | ICD-10-CM | POA: Diagnosis not present

## 2017-04-26 DIAGNOSIS — G8929 Other chronic pain: Secondary | ICD-10-CM | POA: Diagnosis not present

## 2017-04-26 DIAGNOSIS — M25571 Pain in right ankle and joints of right foot: Secondary | ICD-10-CM | POA: Diagnosis not present

## 2017-05-12 DIAGNOSIS — G8929 Other chronic pain: Secondary | ICD-10-CM | POA: Diagnosis not present

## 2017-05-12 DIAGNOSIS — M7989 Other specified soft tissue disorders: Secondary | ICD-10-CM | POA: Diagnosis not present

## 2017-05-12 DIAGNOSIS — Q6689 Other  specified congenital deformities of feet: Secondary | ICD-10-CM | POA: Diagnosis not present

## 2017-05-12 DIAGNOSIS — M25571 Pain in right ankle and joints of right foot: Secondary | ICD-10-CM | POA: Diagnosis not present

## 2017-05-12 DIAGNOSIS — M79671 Pain in right foot: Secondary | ICD-10-CM | POA: Diagnosis not present

## 2017-05-31 ENCOUNTER — Other Ambulatory Visit: Payer: Self-pay

## 2017-05-31 DIAGNOSIS — D2272 Melanocytic nevi of left lower limb, including hip: Secondary | ICD-10-CM | POA: Diagnosis not present

## 2017-05-31 DIAGNOSIS — L720 Epidermal cyst: Secondary | ICD-10-CM | POA: Diagnosis not present

## 2017-05-31 DIAGNOSIS — D485 Neoplasm of uncertain behavior of skin: Secondary | ICD-10-CM | POA: Diagnosis not present

## 2017-05-31 DIAGNOSIS — D224 Melanocytic nevi of scalp and neck: Secondary | ICD-10-CM | POA: Diagnosis not present

## 2017-05-31 DIAGNOSIS — D2262 Melanocytic nevi of left upper limb, including shoulder: Secondary | ICD-10-CM | POA: Diagnosis not present

## 2017-05-31 DIAGNOSIS — D2261 Melanocytic nevi of right upper limb, including shoulder: Secondary | ICD-10-CM | POA: Diagnosis not present

## 2017-05-31 DIAGNOSIS — D2271 Melanocytic nevi of right lower limb, including hip: Secondary | ICD-10-CM | POA: Diagnosis not present

## 2017-05-31 DIAGNOSIS — D225 Melanocytic nevi of trunk: Secondary | ICD-10-CM | POA: Diagnosis not present

## 2017-05-31 MED ORDER — FLUTICASONE PROPIONATE 50 MCG/ACT NA SUSP: 2.0000 | Freq: Every day | NASAL | 5 refills | Status: DC

## 2017-06-09 DIAGNOSIS — M2140 Flat foot [pes planus] (acquired), unspecified foot: Secondary | ICD-10-CM | POA: Diagnosis not present

## 2017-06-09 DIAGNOSIS — Q6689 Other  specified congenital deformities of feet: Secondary | ICD-10-CM | POA: Diagnosis not present

## 2017-06-24 ENCOUNTER — Other Ambulatory Visit: Payer: Self-pay | Admitting: Obstetrics and Gynecology

## 2017-06-24 DIAGNOSIS — Z139 Encounter for screening, unspecified: Secondary | ICD-10-CM

## 2017-08-03 ENCOUNTER — Ambulatory Visit
Admission: RE | Admit: 2017-08-03 | Discharge: 2017-08-03 | Disposition: A | Discharge status: Home / Self Care | Payer: Medicare Other | Source: Ambulatory Visit | Attending: Obstetrics and Gynecology | Admitting: Obstetrics and Gynecology

## 2017-08-03 DIAGNOSIS — Z1231 Encounter for screening mammogram for malignant neoplasm of breast: Secondary | ICD-10-CM | POA: Diagnosis not present

## 2017-08-03 DIAGNOSIS — Z139 Encounter for screening, unspecified: Secondary | ICD-10-CM

## 2017-08-25 DIAGNOSIS — L820 Inflamed seborrheic keratosis: Secondary | ICD-10-CM | POA: Diagnosis not present

## 2017-09-06 DIAGNOSIS — M898X1 Other specified disorders of bone, shoulder: Secondary | ICD-10-CM | POA: Diagnosis not present

## 2017-09-06 DIAGNOSIS — M25511 Pain in right shoulder: Secondary | ICD-10-CM | POA: Diagnosis not present

## 2017-09-06 DIAGNOSIS — Z1389 Encounter for screening for other disorder: Secondary | ICD-10-CM | POA: Diagnosis not present

## 2017-09-06 DIAGNOSIS — Z6820 Body mass index (BMI) 20.0-20.9, adult: Secondary | ICD-10-CM | POA: Diagnosis not present

## 2017-10-04 DIAGNOSIS — H18831 Recurrent erosion of cornea, right eye: Secondary | ICD-10-CM | POA: Diagnosis not present

## 2017-10-04 DIAGNOSIS — H52203 Unspecified astigmatism, bilateral: Secondary | ICD-10-CM | POA: Diagnosis not present

## 2017-10-04 DIAGNOSIS — H524 Presbyopia: Secondary | ICD-10-CM | POA: Diagnosis not present

## 2017-10-06 DIAGNOSIS — Z114 Encounter for screening for human immunodeficiency virus [HIV]: Secondary | ICD-10-CM | POA: Diagnosis not present

## 2017-10-06 DIAGNOSIS — Z113 Encounter for screening for infections with a predominantly sexual mode of transmission: Secondary | ICD-10-CM | POA: Diagnosis not present

## 2017-10-06 DIAGNOSIS — Z1159 Encounter for screening for other viral diseases: Secondary | ICD-10-CM | POA: Diagnosis not present

## 2017-10-13 DIAGNOSIS — Z202 Contact with and (suspected) exposure to infections with a predominantly sexual mode of transmission: Secondary | ICD-10-CM | POA: Diagnosis not present

## 2017-10-13 DIAGNOSIS — Z118 Encounter for screening for other infectious and parasitic diseases: Secondary | ICD-10-CM | POA: Diagnosis not present

## 2017-11-04 DIAGNOSIS — Z0000 Encounter for general adult medical examination without abnormal findings: Secondary | ICD-10-CM | POA: Diagnosis not present

## 2017-11-04 DIAGNOSIS — Z1329 Encounter for screening for other suspected endocrine disorder: Secondary | ICD-10-CM | POA: Diagnosis not present

## 2017-11-04 DIAGNOSIS — Z13220 Encounter for screening for lipoid disorders: Secondary | ICD-10-CM | POA: Diagnosis not present

## 2017-11-04 DIAGNOSIS — Z1389 Encounter for screening for other disorder: Secondary | ICD-10-CM | POA: Diagnosis not present

## 2017-11-04 DIAGNOSIS — B009 Herpesviral infection, unspecified: Secondary | ICD-10-CM | POA: Diagnosis not present

## 2017-11-04 DIAGNOSIS — Z130 Encounter for screening for diseases of the blood and blood-forming organs and certain disorders involving the immune mechanism: Secondary | ICD-10-CM | POA: Diagnosis not present

## 2017-12-05 ENCOUNTER — Other Ambulatory Visit: Payer: Self-pay | Admitting: Allergy and Immunology

## 2017-12-05 MED ORDER — BUDESONIDE-FORMOTEROL FUMARATE 160-4.5 MCG/ACT IN AERO: 2.0000 | INHALATION_SPRAY | Freq: Two times a day (BID) | RESPIRATORY_TRACT | 0 refills | Status: DC

## 2017-12-05 NOTE — Telephone Encounter (Signed)
Patient advised that only one fill would be sent in until the she was seen in office. She stated she understood. She has only been using the Symbicort as needed.

## 2017-12-05 NOTE — Telephone Encounter (Signed)
Patient needs a refill on SYMBICORT sent to [facility] on [address] Patient doesn't know if she had samples before and just needs the script sent in or not

## 2017-12-28 DIAGNOSIS — D2239 Melanocytic nevi of other parts of face: Secondary | ICD-10-CM | POA: Diagnosis not present

## 2017-12-28 DIAGNOSIS — D485 Neoplasm of uncertain behavior of skin: Secondary | ICD-10-CM | POA: Diagnosis not present

## 2018-02-07 DIAGNOSIS — R768 Other specified abnormal immunological findings in serum: Secondary | ICD-10-CM | POA: Diagnosis not present

## 2018-03-07 DIAGNOSIS — Z124 Encounter for screening for malignant neoplasm of cervix: Secondary | ICD-10-CM | POA: Diagnosis not present

## 2018-03-07 DIAGNOSIS — Z01419 Encounter for gynecological examination (general) (routine) without abnormal findings: Secondary | ICD-10-CM | POA: Diagnosis not present

## 2018-03-07 DIAGNOSIS — Z779 Other contact with and (suspected) exposures hazardous to health: Secondary | ICD-10-CM | POA: Diagnosis not present

## 2018-04-19 DIAGNOSIS — D2261 Melanocytic nevi of right upper limb, including shoulder: Secondary | ICD-10-CM | POA: Diagnosis not present

## 2018-04-19 DIAGNOSIS — D224 Melanocytic nevi of scalp and neck: Secondary | ICD-10-CM | POA: Diagnosis not present

## 2018-04-19 DIAGNOSIS — D225 Melanocytic nevi of trunk: Secondary | ICD-10-CM | POA: Diagnosis not present

## 2018-04-19 DIAGNOSIS — D2272 Melanocytic nevi of left lower limb, including hip: Secondary | ICD-10-CM | POA: Diagnosis not present

## 2018-04-19 DIAGNOSIS — D2271 Melanocytic nevi of right lower limb, including hip: Secondary | ICD-10-CM | POA: Diagnosis not present

## 2018-04-19 DIAGNOSIS — D2239 Melanocytic nevi of other parts of face: Secondary | ICD-10-CM | POA: Diagnosis not present

## 2018-04-19 DIAGNOSIS — D2262 Melanocytic nevi of left upper limb, including shoulder: Secondary | ICD-10-CM | POA: Diagnosis not present

## 2018-05-15 DIAGNOSIS — J029 Acute pharyngitis, unspecified: Secondary | ICD-10-CM | POA: Diagnosis not present

## 2018-05-15 DIAGNOSIS — R5383 Other fatigue: Secondary | ICD-10-CM | POA: Diagnosis not present

## 2018-05-15 DIAGNOSIS — Z6821 Body mass index (BMI) 21.0-21.9, adult: Secondary | ICD-10-CM | POA: Diagnosis not present

## 2018-05-15 DIAGNOSIS — D649 Anemia, unspecified: Secondary | ICD-10-CM | POA: Diagnosis not present

## 2018-05-15 DIAGNOSIS — J020 Streptococcal pharyngitis: Secondary | ICD-10-CM | POA: Diagnosis not present

## 2018-05-26 DIAGNOSIS — D649 Anemia, unspecified: Secondary | ICD-10-CM | POA: Diagnosis not present

## 2018-05-26 DIAGNOSIS — R5383 Other fatigue: Secondary | ICD-10-CM | POA: Diagnosis not present

## 2018-05-26 DIAGNOSIS — K921 Melena: Secondary | ICD-10-CM | POA: Diagnosis not present

## 2018-05-26 DIAGNOSIS — Z6820 Body mass index (BMI) 20.0-20.9, adult: Secondary | ICD-10-CM | POA: Diagnosis not present

## 2018-07-06 ENCOUNTER — Other Ambulatory Visit: Payer: Self-pay | Admitting: Obstetrics and Gynecology

## 2018-07-06 DIAGNOSIS — L2389 Allergic contact dermatitis due to other agents: Secondary | ICD-10-CM | POA: Diagnosis not present

## 2018-07-06 DIAGNOSIS — H5789 Other specified disorders of eye and adnexa: Secondary | ICD-10-CM | POA: Diagnosis not present

## 2018-07-06 DIAGNOSIS — Z1231 Encounter for screening mammogram for malignant neoplasm of breast: Secondary | ICD-10-CM

## 2018-07-10 DIAGNOSIS — H0014 Chalazion left upper eyelid: Secondary | ICD-10-CM | POA: Diagnosis not present

## 2018-07-27 DIAGNOSIS — H0014 Chalazion left upper eyelid: Secondary | ICD-10-CM | POA: Diagnosis not present

## 2018-07-27 DIAGNOSIS — H0015 Chalazion left lower eyelid: Secondary | ICD-10-CM | POA: Diagnosis not present

## 2018-07-31 DIAGNOSIS — R591 Generalized enlarged lymph nodes: Secondary | ICD-10-CM | POA: Diagnosis not present

## 2018-07-31 DIAGNOSIS — D6489 Other specified anemias: Secondary | ICD-10-CM | POA: Diagnosis not present

## 2018-07-31 DIAGNOSIS — L0889 Other specified local infections of the skin and subcutaneous tissue: Secondary | ICD-10-CM | POA: Diagnosis not present

## 2018-07-31 DIAGNOSIS — Z6821 Body mass index (BMI) 21.0-21.9, adult: Secondary | ICD-10-CM | POA: Diagnosis not present

## 2018-08-07 DIAGNOSIS — R591 Generalized enlarged lymph nodes: Secondary | ICD-10-CM | POA: Diagnosis not present

## 2018-08-07 DIAGNOSIS — L0889 Other specified local infections of the skin and subcutaneous tissue: Secondary | ICD-10-CM | POA: Diagnosis not present

## 2018-08-08 ENCOUNTER — Ambulatory Visit
Admission: RE | Admit: 2018-08-08 | Discharge: 2018-08-08 | Disposition: A | Discharge status: Home / Self Care | Payer: BLUE CROSS/BLUE SHIELD | Source: Ambulatory Visit | Attending: Obstetrics and Gynecology | Admitting: Obstetrics and Gynecology

## 2018-08-08 DIAGNOSIS — Z1231 Encounter for screening mammogram for malignant neoplasm of breast: Secondary | ICD-10-CM

## 2018-08-15 DIAGNOSIS — H0011 Chalazion right upper eyelid: Secondary | ICD-10-CM | POA: Diagnosis not present

## 2018-08-15 DIAGNOSIS — H0014 Chalazion left upper eyelid: Secondary | ICD-10-CM | POA: Diagnosis not present

## 2018-08-15 DIAGNOSIS — H0015 Chalazion left lower eyelid: Secondary | ICD-10-CM | POA: Diagnosis not present

## 2018-08-15 DIAGNOSIS — H0012 Chalazion right lower eyelid: Secondary | ICD-10-CM | POA: Diagnosis not present

## 2018-09-07 DIAGNOSIS — H0015 Chalazion left lower eyelid: Secondary | ICD-10-CM | POA: Diagnosis not present

## 2018-09-07 DIAGNOSIS — H0014 Chalazion left upper eyelid: Secondary | ICD-10-CM | POA: Diagnosis not present

## 2018-09-07 DIAGNOSIS — H0011 Chalazion right upper eyelid: Secondary | ICD-10-CM | POA: Diagnosis not present

## 2018-09-07 DIAGNOSIS — H0012 Chalazion right lower eyelid: Secondary | ICD-10-CM | POA: Diagnosis not present

## 2018-12-04 DIAGNOSIS — Q828 Other specified congenital malformations of skin: Secondary | ICD-10-CM | POA: Diagnosis not present

## 2018-12-04 DIAGNOSIS — D2262 Melanocytic nevi of left upper limb, including shoulder: Secondary | ICD-10-CM | POA: Diagnosis not present

## 2018-12-04 DIAGNOSIS — D2261 Melanocytic nevi of right upper limb, including shoulder: Secondary | ICD-10-CM | POA: Diagnosis not present

## 2018-12-04 DIAGNOSIS — D225 Melanocytic nevi of trunk: Secondary | ICD-10-CM | POA: Diagnosis not present

## 2019-02-26 DIAGNOSIS — R5383 Other fatigue: Secondary | ICD-10-CM | POA: Diagnosis not present

## 2019-03-01 DIAGNOSIS — G4700 Insomnia, unspecified: Secondary | ICD-10-CM | POA: Diagnosis not present

## 2019-03-01 DIAGNOSIS — R002 Palpitations: Secondary | ICD-10-CM | POA: Diagnosis not present

## 2019-03-01 DIAGNOSIS — R5383 Other fatigue: Secondary | ICD-10-CM | POA: Diagnosis not present

## 2019-03-01 DIAGNOSIS — K449 Diaphragmatic hernia without obstruction or gangrene: Secondary | ICD-10-CM | POA: Diagnosis not present

## 2019-03-14 DIAGNOSIS — Z6821 Body mass index (BMI) 21.0-21.9, adult: Secondary | ICD-10-CM | POA: Diagnosis not present

## 2019-03-14 DIAGNOSIS — Z01419 Encounter for gynecological examination (general) (routine) without abnormal findings: Secondary | ICD-10-CM | POA: Diagnosis not present

## 2019-04-02 ENCOUNTER — Telehealth (HOSPITAL_COMMUNITY): Payer: Self-pay | Admitting: *Deleted

## 2019-04-02 NOTE — Telephone Encounter (Addendum)
Patient would like to research cost prior to scheduling will call back if she would like to schedule.  I will notify referring office.  Pt provided number for financial counseling.  This patient stated someone for our department contacted her last week and she informed them of this.  I have found record of this in the patients chart.

## 2019-04-02 NOTE — Telephone Encounter (Signed)
Left voicemail asking pt to call and ask for vascular lab scheduling.

## 2019-04-10 DIAGNOSIS — L820 Inflamed seborrheic keratosis: Secondary | ICD-10-CM | POA: Diagnosis not present

## 2019-07-03 ENCOUNTER — Other Ambulatory Visit: Payer: Self-pay | Admitting: Obstetrics and Gynecology

## 2019-07-03 DIAGNOSIS — Z1231 Encounter for screening mammogram for malignant neoplasm of breast: Secondary | ICD-10-CM

## 2019-08-15 ENCOUNTER — Ambulatory Visit: Payer: BLUE CROSS/BLUE SHIELD

## 2019-08-23 ENCOUNTER — Other Ambulatory Visit: Payer: Self-pay

## 2019-08-23 ENCOUNTER — Ambulatory Visit: Payer: Self-pay

## 2019-08-23 ENCOUNTER — Ambulatory Visit
Admission: RE | Admit: 2019-08-23 | Discharge: 2019-08-23 | Disposition: A | Discharge status: Home / Self Care | Payer: PRIVATE HEALTH INSURANCE | Source: Ambulatory Visit | Attending: Obstetrics and Gynecology | Admitting: Obstetrics and Gynecology

## 2019-08-23 DIAGNOSIS — Z1231 Encounter for screening mammogram for malignant neoplasm of breast: Secondary | ICD-10-CM

## 2020-03-17 ENCOUNTER — Other Ambulatory Visit: Payer: Self-pay | Admitting: Internal Medicine

## 2020-07-10 ENCOUNTER — Other Ambulatory Visit: Payer: Self-pay | Admitting: Obstetrics and Gynecology

## 2020-07-10 DIAGNOSIS — Z1231 Encounter for screening mammogram for malignant neoplasm of breast: Secondary | ICD-10-CM

## 2020-07-29 ENCOUNTER — Other Ambulatory Visit: Payer: PRIVATE HEALTH INSURANCE

## 2020-07-29 DIAGNOSIS — Z20822 Contact with and (suspected) exposure to covid-19: Secondary | ICD-10-CM

## 2020-07-31 LAB — NOVEL CORONAVIRUS, NAA: SARS-CoV-2, NAA: DETECTED — AB

## 2020-07-31 LAB — SARS-COV-2, NAA 2 DAY TAT

## 2020-08-25 ENCOUNTER — Inpatient Hospital Stay: Admission: RE | Admit: 2020-08-25 | Payer: PRIVATE HEALTH INSURANCE | Source: Ambulatory Visit

## 2020-08-30 ENCOUNTER — Ambulatory Visit
Admission: RE | Admit: 2020-08-30 | Discharge: 2020-08-30 | Disposition: A | Discharge status: Home / Self Care | Payer: PRIVATE HEALTH INSURANCE | Source: Ambulatory Visit | Attending: Obstetrics and Gynecology | Admitting: Obstetrics and Gynecology

## 2020-08-30 ENCOUNTER — Other Ambulatory Visit: Payer: Self-pay

## 2020-08-30 DIAGNOSIS — Z1231 Encounter for screening mammogram for malignant neoplasm of breast: Secondary | ICD-10-CM

## 2022-01-16 ENCOUNTER — Emergency Department (HOSPITAL_COMMUNITY): Payer: Commercial Managed Care - HMO

## 2022-01-16 ENCOUNTER — Observation Stay (HOSPITAL_COMMUNITY)
Admission: EM | Admit: 2022-01-16 | Discharge: 2022-01-17 | Disposition: A | Discharge status: Home / Self Care | Payer: Commercial Managed Care - HMO | Attending: Internal Medicine | Admitting: Internal Medicine

## 2022-01-16 ENCOUNTER — Other Ambulatory Visit: Payer: Self-pay

## 2022-01-16 ENCOUNTER — Encounter (HOSPITAL_COMMUNITY): Payer: Self-pay

## 2022-01-16 DIAGNOSIS — G4700 Insomnia, unspecified: Secondary | ICD-10-CM | POA: Diagnosis present

## 2022-01-16 DIAGNOSIS — D649 Anemia, unspecified: Secondary | ICD-10-CM | POA: Diagnosis present

## 2022-01-16 DIAGNOSIS — R1313 Dysphagia, pharyngeal phase: Secondary | ICD-10-CM | POA: Insufficient documentation

## 2022-01-16 DIAGNOSIS — Z79899 Other long term (current) drug therapy: Secondary | ICD-10-CM | POA: Insufficient documentation

## 2022-01-16 DIAGNOSIS — R1031 Right lower quadrant pain: Secondary | ICD-10-CM

## 2022-01-16 DIAGNOSIS — G43B0 Ophthalmoplegic migraine, not intractable: Secondary | ICD-10-CM | POA: Insufficient documentation

## 2022-01-16 DIAGNOSIS — Z91040 Latex allergy status: Secondary | ICD-10-CM | POA: Insufficient documentation

## 2022-01-16 DIAGNOSIS — E785 Hyperlipidemia, unspecified: Secondary | ICD-10-CM | POA: Diagnosis present

## 2022-01-16 DIAGNOSIS — R109 Unspecified abdominal pain: Secondary | ICD-10-CM | POA: Diagnosis not present

## 2022-01-16 LAB — CBC
HCT: 39.3 % (ref 36.0–46.0)
Hemoglobin: 12.9 g/dL (ref 12.0–15.0)
MCH: 27.1 pg (ref 26.0–34.0)
MCHC: 32.8 g/dL (ref 30.0–36.0)
MCV: 82.6 fL (ref 80.0–100.0)
Platelets: 309 10*3/uL (ref 150–400)
RBC: 4.76 MIL/uL (ref 3.87–5.11)
RDW: 14.5 % (ref 11.5–15.5)
WBC: 7.8 10*3/uL (ref 4.0–10.5)
nRBC: 0 % (ref 0.0–0.2)

## 2022-01-16 LAB — COMPREHENSIVE METABOLIC PANEL
ALT: 18 U/L (ref 0–44)
AST: 24 U/L (ref 15–41)
Albumin: 3.9 g/dL (ref 3.5–5.0)
Alkaline Phosphatase: 50 U/L (ref 38–126)
Anion gap: 12 (ref 5–15)
BUN: 12 mg/dL (ref 6–20)
CO2: 24 mmol/L (ref 22–32)
Calcium: 9.4 mg/dL (ref 8.9–10.3)
Chloride: 101 mmol/L (ref 98–111)
Creatinine, Ser: 0.67 mg/dL (ref 0.44–1.00)
GFR, Estimated: >60 mL/min (ref >60)
Glucose, Bld: 108 mg/dL — ABNORMAL HIGH (ref 70–99)
Potassium: 4 mmol/L (ref 3.5–5.1)
Sodium: 137 mmol/L (ref 135–145)
Total Bilirubin: 0.6 mg/dL (ref 0.3–1.2)
Total Protein: 6.9 g/dL (ref 6.5–8.1)

## 2022-01-16 LAB — URINALYSIS, ROUTINE W REFLEX MICROSCOPIC
Bilirubin Urine: NEGATIVE
Glucose, UA: NEGATIVE mg/dL
Hgb urine dipstick: NEGATIVE
Ketones, ur: NEGATIVE mg/dL
Leukocytes,Ua: NEGATIVE
Nitrite: NEGATIVE
Protein, ur: NEGATIVE mg/dL
Specific Gravity, Urine: 1.008 (ref 1.005–1.030)
pH: 8 (ref 5.0–8.0)

## 2022-01-16 LAB — I-STAT BETA HCG BLOOD, ED (MC, WL, AP ONLY): I-stat hCG, quantitative: <5 m[IU]/mL (ref <5)

## 2022-01-16 MED ORDER — SODIUM CHLORIDE 0.9% FLUSH
3.0000 mL | Freq: Two times a day (BID) | INTRAVENOUS | Status: DC
  Administered 2022-01-16: 3 mL via INTRAVENOUS

## 2022-01-16 MED ORDER — POLYETHYLENE GLYCOL 3350 17 G PO PACK
17.0000 g | PACK | Freq: Every day | ORAL | Status: DC | PRN
Start: 2022-01-16 — End: 2022-01-17

## 2022-01-16 MED ORDER — KETOROLAC TROMETHAMINE 15 MG/ML IJ SOLN
15.0000 mg | Freq: Once | INTRAMUSCULAR | Status: DC
  Filled 2022-01-16: qty 1

## 2022-01-16 MED ORDER — SENNA 8.6 MG PO TABS
1.0000 | ORAL_TABLET | Freq: Once | ORAL | Status: AC
  Administered 2022-01-16: 8.6 mg via ORAL
  Filled 2022-01-16: qty 1

## 2022-01-16 MED ORDER — ONDANSETRON HCL 4 MG/2ML IJ SOLN
4.0000 mg | Freq: Once | INTRAMUSCULAR | Status: AC
  Administered 2022-01-16: 4 mg via INTRAVENOUS
  Filled 2022-01-16: qty 2

## 2022-01-16 MED ORDER — HYDROCODONE-ACETAMINOPHEN 5-325 MG PO TABS
1.0000 | ORAL_TABLET | Freq: Once | ORAL | Status: AC
  Administered 2022-01-16: 1 via ORAL
  Filled 2022-01-16: qty 1

## 2022-01-16 MED ORDER — LACTATED RINGERS IV BOLUS
1000.0000 mL | Freq: Once | INTRAVENOUS | Status: AC
  Administered 2022-01-16: 1000 mL via INTRAVENOUS

## 2022-01-16 MED ORDER — HYDROMORPHONE HCL 1 MG/ML IJ SOLN: 0.5000 mg | INTRAMUSCULAR | Status: DC | PRN

## 2022-01-16 MED ORDER — HYDROCODONE-ACETAMINOPHEN 5-325 MG PO TABS: 1.0000 | ORAL_TABLET | Freq: Once | ORAL | Status: DC

## 2022-01-16 MED ORDER — HYDROCODONE-ACETAMINOPHEN 5-325 MG PO TABS
1.0000 | ORAL_TABLET | ORAL | Status: DC | PRN
  Administered 2022-01-17: 1 via ORAL
  Filled 2022-01-16: qty 1

## 2022-01-16 MED ORDER — MORPHINE SULFATE (PF) 4 MG/ML IV SOLN
4.0000 mg | Freq: Once | INTRAVENOUS | Status: AC
  Administered 2022-01-16: 4 mg via INTRAVENOUS
  Filled 2022-01-16: qty 1

## 2022-01-16 MED ORDER — IOHEXOL 300 MG/ML  SOLN
75.0000 mL | Freq: Once | INTRAMUSCULAR | Status: AC | PRN
  Administered 2022-01-16: 75 mL via INTRAVENOUS

## 2022-01-16 MED ORDER — ENOXAPARIN SODIUM 40 MG/0.4ML IJ SOSY: 40.0000 mg | PREFILLED_SYRINGE | INTRAMUSCULAR | Status: DC

## 2022-01-16 MED ORDER — ACETAMINOPHEN 325 MG PO TABS
650.0000 mg | ORAL_TABLET | Freq: Four times a day (QID) | ORAL | Status: DC | PRN
  Administered 2022-01-17: 650 mg via ORAL
  Filled 2022-01-16: qty 2

## 2022-01-16 MED ORDER — HYDROCODONE-ACETAMINOPHEN 5-325 MG PO TABS
2.0000 | ORAL_TABLET | Freq: Once | ORAL | Status: AC
  Administered 2022-01-16: 2 via ORAL
  Filled 2022-01-16: qty 2

## 2022-01-16 MED ORDER — ACETAMINOPHEN 650 MG RE SUPP: 650.0000 mg | Freq: Four times a day (QID) | RECTAL | Status: DC | PRN

## 2022-01-16 NOTE — ED Notes (Signed)
Patient transported to CT 

## 2022-01-16 NOTE — H&P (Signed)
History and Physical   Tracey Proctor NUU:[id number] DOB: [birth year] DOA: 01/16/2022  PCP: Pcp, No   Patient coming from: Home  Chief Complaint: Abdominal pain  HPI: Tracey Proctor is a 51 y.o. female with medical history significant of anemia, diaphragmatic hernia, hyperlipidemia, insomnia, migraine, pharyngeal dysphagia presenting with right lower quadrant abdominal pain.  She reports sudden onset of right lower quadrant abdominal pain 90 minutes prior to arrival.  Pain rated as an 8 out of 10***pain that does not radiate.  No nausea nor vomiting.  Denies any vaginal symptoms.  She does have history of ovarian cyst.  She denies fevers, chills, chest pain, constipation, diarrhea.***  ED Course: Vital signs in the ED stable.  Lab work-up included CMP notable for glucose of 108, CBC within normal limits.  Urinalysis normal.  CT of the abdomen pelvis showing only small free fluid in the pelvis.  Pelvic ultrasound showing retroverted uterus with small fibroids and small complex cyst of the right ovary with questionable hemorrhagic component.  Her OB/GYN office was consulted and stated that her cyst appears stable from previous.  They did note on imaging by their read fluid in the fallopian tube which could cause symptoms and they plan to follow-up patient outpatient for possible elective surgery.  Unfortunately, pain was unable to be controlled in the ED and due to this patient is uncomfortable going home as she lives alone and is still in significant pain.  Patient to be observed for pain control.  Review of Systems: As per HPI otherwise all other systems reviewed and are negative.  History reviewed. No pertinent past medical history.  History reviewed. No pertinent surgical history.  Social History  has no history on file for tobacco use, alcohol use, and drug use.  Allergies  Allergen Reactions   Latex Anaphylaxis   Motrin [Ibuprofen] Anaphylaxis   Nsaids Anaphylaxis    Doxycycline Nausea And Vomiting   Sulfa Antibiotics Rash    No family history on file. Reviewed on admission***  Prior to Admission medications   Medication Sig Start Date End Date Taking? Authorizing Provider  B Complex Vitamins (B COMPLEX PO) Take 1 capsule by mouth daily.   Yes [provider]  cholecalciferol (VITAMIN D3) 25 MCG (1000 UNIT) tablet Take 1,000 Units by mouth daily.   Yes [provider]  cyclobenzaprine (FLEXERIL) 5 MG tablet Take 5 mg by mouth 3 (three) times daily as needed for muscle spasms.   Yes [provider]  HYDROcodone-acetaminophen (NORCO/VICODIN) 5-325 MG tablet Take 1 tablet by mouth every 6 (six) hours as needed for moderate pain.   Yes [provider]  IRON HEME POLYPEPTIDE PO Take 1 tablet by mouth daily.   Yes [provider]  LYSINE HCL PO Take 1 tablet by mouth daily.   Yes [provider]  Multiple Vitamins-Minerals (MULTIVITAMIN ADULTS PO) Take 1 tablet by mouth daily.   Yes [provider]    Physical Exam: Vitals:   01/16/22 1730 01/16/22 1800 01/16/22 1915 01/16/22 2100  BP: 134/69 132/76 136/84 133/81  Pulse: 81 75 97 96  Resp: 16 18 19 18   Temp:      TempSrc:      SpO2: 100% 100% 99% 99%    Physical Exam  Labs on Admission: I have personally reviewed following labs and imaging studies  CBC: Recent Labs  Lab 01/16/22 1550  WBC 7.8  HGB 12.9  HCT 39.3  MCV 82.6  PLT 309    Basic Metabolic  Panel: Recent Labs  Lab 01/16/22 1550  NA 137  K 4.0  CL 101  CO2 24  GLUCOSE 108*  BUN 12  CREATININE 0.67  CALCIUM 9.4    GFR: CrCl cannot be calculated (Unknown ideal weight.).  Liver Function Tests: Recent Labs  Lab 01/16/22 1550  AST 24  ALT 18  ALKPHOS 50  BILITOT 0.6  PROT 6.9  ALBUMIN 3.9    Urine analysis:    Component Value Date/Time   COLORURINE YELLOW 01/16/2022 1550   APPEARANCEUR CLOUDY (A) 01/16/2022 1550   LABSPEC 1.008 01/16/2022 1550    PHURINE 8.0 01/16/2022 1550   GLUCOSEU NEGATIVE 01/16/2022 1550   HGBUR NEGATIVE 01/16/2022 1550   BILIRUBINUR NEGATIVE 01/16/2022 1550   KETONESUR NEGATIVE 01/16/2022 1550   PROTEINUR NEGATIVE 01/16/2022 1550   NITRITE NEGATIVE 01/16/2022 1550   LEUKOCYTESUR NEGATIVE 01/16/2022 1550    Radiological Exams on Admission: US PELVIC COMPLETE W TRANSVAGINAL AND TORSION R/O  Result Date: 01/16/2022 CLINICAL DATA:  Pelvic pain. EXAM: TRANSABDOMINAL AND TRANSVAGINAL ULTRASOUND OF PELVIS DOPPLER ULTRASOUND OF OVARIES TECHNIQUE: Both transabdominal and transvaginal ultrasound examinations of the pelvis were performed. Transabdominal technique was performed for global imaging of the pelvis including uterus, ovaries, adnexal regions, and pelvic cul-de-sac. It was necessary to proceed with endovaginal exam following the transabdominal exam to visualize the uterus, ovaries and adnexa. Color and duplex Doppler ultrasound was utilized to evaluate blood flow to the ovaries. COMPARISON:  CT earlier today FINDINGS: Uterus Measurements: 13.1 x 6.2 x 6 cm = volume: 254 mL. The uterus is retroverted. Myometrium is heterogeneous. There are 3 small uterine fibroids. Intramural fibroid measures 1.3 x 1.4 cm in the posterior right fundus. Subserosal fibroid measures 1.2 x 1.4 cm in the posterior right fundus. There is a submucosal 0.9 x 1.1 cm fibroid in the anterior right fundus. Endometrium Thickness: 11 mm.  No focal abnormality visualized. Right ovary Measurements: 5 x 2.9 x 3.6 cm = volume: 27.4 mL. Small complex cyst measuring 3.3 x 2.7 x 3 cm with low level internal echoes. There is blood flow to the ovarian parenchyma. No adnexal mass. Left ovary Measurements: 3.1 x 1.1 x 1.9 cm = volume: 3.4 mL. Physiologic with a few small follicles. No dominant cyst or solid lesion. Normal blood flow. No adnexal mass. Pulsed Doppler evaluation of both ovaries demonstrates normal low-resistance arterial and venous waveforms. Other  findings Small amount of simple free fluid, as seen on CT. IMPRESSION: 1. Retroverted uterus with small fibroids, at least 1 of which is submucosal. 2. Small complex cyst in the right ovary with low level internal echoes, 3.3 cm greatest dimension. This may represent a hemorrhagic cyst. Given age, recommend follow-up to resolution, pelvic ultrasound in 6-12 weeks recommended. 3. Normal blood flow to both ovaries. Electronically Signed   By: Narda Rutherford M.D.   On: 01/16/2022 20:39   CT ABDOMEN PELVIS W CONTRAST  Result Date: 01/16/2022 CLINICAL DATA:  Acute generalized abdominal pain. EXAM: CT ABDOMEN AND PELVIS WITH CONTRAST TECHNIQUE: Multidetector CT imaging of the abdomen and pelvis was performed using the standard protocol following bolus administration of intravenous contrast. RADIATION DOSE REDUCTION: This exam was performed according to the departmental dose-optimization program which includes automated exposure control, adjustment of the mA and/or kV according to patient size and/or use of iterative reconstruction technique. CONTRAST:  56mL OMNIPAQUE IOHEXOL 300 MG/ML  SOLN COMPARISON:  None Available. FINDINGS: Lower Chest: No acute findings. Hepatobiliary: Multiple small hepatic cysts are seen in both right  and left hepatic lobes. No hepatic masses identified. Gallbladder is unremarkable. No evidence of biliary ductal dilatation. Pancreas:  No mass or inflammatory changes. Spleen: Within normal limits in size and appearance. Adrenals/Urinary Tract: No masses identified. No evidence of ureteral calculi or hydronephrosis. Unremarkable unopacified urinary bladder. Stomach/Bowel: No evidence of obstruction, inflammatory process or abnormal fluid collections. Normal appendix visualized. Vascular/Lymphatic: No pathologically enlarged lymph nodes. No acute vascular findings. Reproductive: No masses identified. Small amount of free fluid seen in pelvic cul-de-sac is nonspecific, but likely physiologic.  Other:  None. Musculoskeletal:  No suspicious bone lesions identified. IMPRESSION: Small amount of free fluid in pelvic cul-de-sac, which is nonspecific but likely physiologic. Otherwise unremarkable exam. Electronically Signed   By: Danae Orleans M.D.   On: 01/16/2022 19:05    EKG: Not performed in the emergency department  Assessment/Plan Principal Problem:   Intractable abdominal pain Active Problems:   Anemia   Hyperlipidemia   Insomnia   Intractable abdominal pain > Patient presenting with abdominal pain in the right lower quadrant.  Sudden onset prior to admission. > Imaging notable only for free fluid in the pelvis and small right ovarian cyst.  This ovarian cyst was discussed with her OB/GYN office who felt like this was stable from previous imaging. > Her OB/GYN however did review imaging and noted fluid in the fallopian tube which can at times cause symptoms.  Plan was for her to follow-up with them outpatient for possible elective surgery however patient's pain unable to be controlled in the ED and patient to be admitted for pain control. 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 -If pain fails to improve consider reconsult of OB/GYN  Not on any chronic medications ***  DVT prophylaxis: Lovenox Code Status:   Full Family Communication:  ***  Disposition Plan:   Patient is from:  Home  Anticipated DC to:  Home  Anticipated DC date:  1 to 2 days  Anticipated DC barriers: None  Consults called:  OB/GYN, consulted by EDP, not following, have left recommendations.  Reconsult as needed. Admission status:  Observation, telemetry  Severity of Illness: The appropriate patient status for this patient is OBSERVATION. Observation status is judged to be reasonable  and necessary in order to provide the required intensity of service to ensure the patient's safety. The patient's presenting symptoms, physical exam findings, and initial radiographic and laboratory data in the context of their medical condition is felt to place them at decreased risk for further clinical deterioration. Furthermore, it is anticipated that the patient will be medically stable for discharge from the hospital within 2 midnights of admission.    Synetta Fail MD [facility]  How to contact the [facility] Attending or Consulting provider 7A - 7P or covering provider during after hours 7P -7A, for this patient?   Check the care team in [facility] and look for a) attending/consulting [facility] provider listed and b) the [facility] team listed Log into [email or web address] and use [facility] universal password to access. If you do not have the password, please contact the hospital operator. Locate the [facility] provider you are looking for under [facility] and page to a number that you can be directly reached. If you still have difficulty reaching the provider, please page the [facility] (Director  on Call) for the Hospitalists listed on amion for assistance.  01/16/2022, 11:08 PM

## 2022-01-16 NOTE — ED Provider Notes (Signed)
[facility] EMERGENCY DEPARTMENT Provider Note   CSN: [id number] Arrival date & time: 01/16/22  1538     History  No chief complaint on file.   Tracey Proctor is a 51 y.o. female.  51 year old female with history of right ovarian cyst presents today for sudden onset right lower quadrant abdominal pain onset about 90 minutes prior to arrival.  Denies nausea, vomiting.  Tolerated p.o. intake earlier today.  Afebrile.  Patient bent forward during interview.  Denies radiation of her abdominal pain.  The history is provided by the patient. No language interpreter was used.       Home Medications Prior to Admission medications   Not on File      Allergies    Patient has no allergy information on record.    Review of Systems   Review of Systems  Constitutional:  Negative for chills and fever.  Gastrointestinal:  Positive for abdominal distention and abdominal pain. Negative for nausea and vomiting.  All other systems reviewed and are negative.   Physical Exam Updated Vital Signs BP 140/83   Pulse 80   Temp 98.3 F (36.8 C) (Oral)   Resp 16   SpO2 100%  Physical Exam Vitals and nursing note reviewed.  Constitutional:      General: She is not in acute distress.    Appearance: Normal appearance. She is not ill-appearing.  HENT:     Head: Normocephalic and atraumatic.     Nose: Nose normal.  Eyes:     General: No scleral icterus.    Extraocular Movements: Extraocular movements intact.     Conjunctiva/sclera: Conjunctivae normal.  Cardiovascular:     Rate and Rhythm: Normal rate and regular rhythm.     Pulses: Normal pulses.  Pulmonary:     Effort: Pulmonary effort is normal. No respiratory distress.     Breath sounds: Normal breath sounds. No wheezing or rales.  Abdominal:     General: There is no distension.     Tenderness: There is abdominal tenderness. There is guarding. There is no right CVA tenderness or left CVA tenderness.   Musculoskeletal:        General: Normal range of motion.     Cervical back: Normal range of motion.  Skin:    General: Skin is warm and dry.  Neurological:     General: No focal deficit present.     Mental Status: She is alert. Mental status is at baseline.     ED Results / Procedures / Treatments   Labs (all labs ordered are listed, but only abnormal results are displayed) Labs Reviewed  COMPREHENSIVE METABOLIC PANEL - Abnormal; Notable for the following components:      Result Value   Glucose, Bld 108 (*)    All other components within normal limits  URINALYSIS, ROUTINE W REFLEX MICROSCOPIC - Abnormal; Notable for the following components:   APPearance CLOUDY (*)    All other components within normal limits  CBC  I-STAT BETA HCG BLOOD, ED ([facility], [facility], [facility] ONLY)    EKG None  Radiology No results found.  Procedures Procedures    Medications Ordered in ED Medications  morphine (PF) 4 MG/ML injection 4 mg (has no administration in time range)  ondansetron (ZOFRAN) injection 4 mg (has no administration in time range)  ketorolac (TORADOL) 15 MG/ML injection 15 mg (has no administration in time range)  lactated ringers bolus 1,000 mL (has no administration in time range)    ED Course/ Medical  Decision Making/ A&P Clinical Course as of 01/16/22 2309  Sat Jan 16, 2022  1727 Patient refusing Toradol.  States has anaphylactic allergic reaction to Motrin.  Refusing Zofran as well as IV morphine.  Will order a dose of Norco. [AA]  2056 CT without acute intra-abdominal findings to explain patient's symptoms.  Pelvic ultrasound demonstrates small right complex ovarian cyst.  Measuring at most 3.3 cm.  No evidence of ovarian torsion.  Patient still reports significant abdominal pain. [AA]  2127 Leukocytes,Ua: NEGATIVE [AA]    Clinical Course User Index [AA] Evlyn Courier, PA-C                           Medical Decision Making Amount and/or Complexity of Data Reviewed Labs:  ordered. Decision-making details documented in ED Course. Radiology: ordered.  Risk Prescription drug management. Decision regarding hospitalization.   Medical Decision Making / ED Course   This patient presents to the ED for concern of right lower quadrant abdominal pain, this involves an extensive number of treatment options, and is a complaint that carries with it a high risk of complications and morbidity.  The differential diagnosis includes ovarian torsion, appendicitis, PID, UTI  MDM: 51 year old female presents today for sudden onset right lower quadrant abdominal pain 90 minutes prior to arrival.  History of right ovarian cyst.  CBC without leukocytosis or anemia.  CMP unremarkable.  UA without evidence of UTI.  hCG negative.  No prior history of abdominal surgeries.  Will evaluate with CT abdomen pelvis with contrast and provide pain medication and fluids. Case discussed with on-call gynecologist with [facility].  They reviewed patient's previous ultrasounds dating back to November 2022.  Right ovarian cyst is stable in size.  They states she did have evidence of hydro salpinx previously which could at times lead to pain.  If pain is controlled and patient is discharged they plan on reaching out to patient on Monday to follow-up and schedule an appointment to discuss elective salpingectomy.  Patient denies dysuria, vaginal discharge, recent change in sexual partner.  Discussed PID is of concern and offered pelvic exam which patient refuses.  Given uncontrolled pain.  Reports significant improvement from the time of arrival however still with significant pain.  Still with significant abdominal tenderness and guarding.  Imaging without source.  Discussed with hospitalist who will evaluate patient for admission for pain management.   Lab Tests: -I ordered, reviewed, and interpreted labs.   The pertinent results include:   Labs Reviewed  COMPREHENSIVE METABOLIC PANEL - Abnormal;  Notable for the following components:      Result Value   Glucose, Bld 108 (*)    All other components within normal limits  URINALYSIS, ROUTINE W REFLEX MICROSCOPIC - Abnormal; Notable for the following components:   APPearance CLOUDY (*)    All other components within normal limits  CBC  I-STAT BETA HCG BLOOD, ED ([facility], [facility], [facility] ONLY)      EKG  EKG Interpretation  Date/Time:    Ventricular Rate:    PR Interval:    QRS Duration:   QT Interval:    QTC Calculation:   R Axis:     Text Interpretation:           Imaging Studies ordered: I ordered imaging studies including CT abdomen pelvis with contrast, pelvic ultrasound I independently visualized and interpreted imaging. I agree with the radiologist interpretation   Medicines ordered and prescription drug management: Meds ordered  this encounter  Medications   morphine (PF) 4 MG/ML injection 4 mg   ondansetron (ZOFRAN) injection 4 mg   ketorolac (TORADOL) 15 MG/ML injection 15 mg   lactated ringers bolus 1,000 mL    -I have reviewed the patients home medicines and have made adjustments as needed  Reevaluation: After the interventions noted above, I reevaluated the patient and found that they have :improved  Co morbidities that complicate the patient evaluation History reviewed. No pertinent past medical history.    Dispostion: Patient discussed with hospitalist will evaluate patient for admission.  Final Clinical Impression(s) / ED Diagnoses Final diagnoses:  Right lower quadrant abdominal pain    Rx / DC Orders ED Discharge Orders     None         Evlyn Courier, PA-C 01/16/22 2312    Elnora Morrison, MD 01/17/22 2354

## 2022-01-16 NOTE — ED Notes (Signed)
Per Karie Mainland PA, pt now agreeing to morphine due to increased pain.

## 2022-01-16 NOTE — ED Notes (Signed)
Patient transported to Ultrasound 

## 2022-01-16 NOTE — ED Triage Notes (Signed)
Patient complains of sudden onset of right ovarian pain x 1 hour. States that they have been watching a cyst on her ovary but has never had pain with same. No bleeding

## 2022-01-17 DIAGNOSIS — D510 Vitamin B12 deficiency anemia due to intrinsic factor deficiency: Secondary | ICD-10-CM | POA: Diagnosis not present

## 2022-01-17 DIAGNOSIS — R109 Unspecified abdominal pain: Secondary | ICD-10-CM | POA: Diagnosis not present

## 2022-01-17 LAB — CBC
HCT: 38.8 % (ref 36.0–46.0)
HCT: 40.1 % (ref 36.0–46.0)
Hemoglobin: 12.5 g/dL (ref 12.0–15.0)
Hemoglobin: 12.8 g/dL (ref 12.0–15.0)
MCH: 26.9 pg (ref 26.0–34.0)
MCH: 27 pg (ref 26.0–34.0)
MCHC: 31.9 g/dL (ref 30.0–36.0)
MCHC: 32.2 g/dL (ref 30.0–36.0)
MCV: 83.8 fL (ref 80.0–100.0)
MCV: 84.2 fL (ref 80.0–100.0)
Platelets: 294 10*3/uL (ref 150–400)
Platelets: 304 10*3/uL (ref 150–400)
RBC: 4.63 MIL/uL (ref 3.87–5.11)
RBC: 4.76 MIL/uL (ref 3.87–5.11)
RDW: 14.5 % (ref 11.5–15.5)
RDW: 14.5 % (ref 11.5–15.5)
WBC: 11.2 10*3/uL — ABNORMAL HIGH (ref 4.0–10.5)
WBC: 8.5 10*3/uL (ref 4.0–10.5)
nRBC: 0 % (ref 0.0–0.2)
nRBC: 0 % (ref 0.0–0.2)

## 2022-01-17 LAB — COMPREHENSIVE METABOLIC PANEL
ALT: 19 U/L (ref 0–44)
AST: 20 U/L (ref 15–41)
Albumin: 3.7 g/dL (ref 3.5–5.0)
Alkaline Phosphatase: 51 U/L (ref 38–126)
Anion gap: 11 (ref 5–15)
BUN: 7 mg/dL (ref 6–20)
CO2: 25 mmol/L (ref 22–32)
Calcium: 9 mg/dL (ref 8.9–10.3)
Chloride: 101 mmol/L (ref 98–111)
Creatinine, Ser: 0.65 mg/dL (ref 0.44–1.00)
GFR, Estimated: >60 mL/min (ref >60)
Glucose, Bld: 98 mg/dL (ref 70–99)
Potassium: 3.9 mmol/L (ref 3.5–5.1)
Sodium: 137 mmol/L (ref 135–145)
Total Bilirubin: 0.6 mg/dL (ref 0.3–1.2)
Total Protein: 6.4 g/dL — ABNORMAL LOW (ref 6.5–8.1)

## 2022-01-17 LAB — CREATININE, SERUM
Creatinine, Ser: 0.57 mg/dL (ref 0.44–1.00)
GFR, Estimated: >60 mL/min (ref >60)

## 2022-01-17 LAB — HIV ANTIBODY (ROUTINE TESTING W REFLEX): HIV Screen 4th Generation wRfx: NONREACTIVE

## 2022-01-17 MED ORDER — SENNA 8.6 MG PO TABS: 1.0000 | ORAL_TABLET | Freq: Every day | ORAL | 0 refills | Status: DC | PRN

## 2022-01-17 NOTE — Hospital Course (Signed)
51 y.o. female with medical history significant of anemia, diaphragmatic hernia, hyperlipidemia, insomnia, migraine, pharyngeal dysphagia presenting with right lower quadrant abdominal pain.  She reports sudden onset of right lower quadrant abdominal pain 90 minutes prior to arrival.  Pain rated as an 8 out of 10 pain that does not radiate, starting to improve when seen (now able to extend legs without pain).  No nausea nor vomiting.  Denies any vaginal symptoms.  She does have history of ovarian cyst.

## 2022-01-17 NOTE — Discharge Summary (Signed)
Physician Discharge Summary   Patient: Tracey Proctor MRN: [id number] DOB: 07-24-1970  Admit date:     01/16/2022  Discharge date: 01/17/22  Discharge Physician: Rickey Barbara   PCP: Pcp, No   Recommendations at discharge:   Follow up with PCP in 1-2 weeks Follow up with Ob/GYN as scheduled   Discharge Diagnoses: Principal Problem:   Intractable abdominal pain Active Problems:   Anemia   Hyperlipidemia   Insomnia  Resolved Problems:   * No resolved hospital problems. *  Hospital Course: 51 y.o. female with medical history significant of anemia, diaphragmatic hernia, hyperlipidemia, insomnia, migraine, pharyngeal dysphagia presenting with right lower quadrant abdominal pain.  She reports sudden onset of right lower quadrant abdominal pain 90 minutes prior to arrival.  Pain rated as an 8 out of 10 pain that does not radiate, starting to improve when seen (now able to extend legs without pain).  No nausea nor vomiting.  Denies any vaginal symptoms.  She does have history of ovarian cyst.  Assessment and Plan: Intractable abdominal pain > Patient presenting with abdominal pain in the right lower quadrant.  Sudden onset prior to admission. > Imaging notable only for free fluid in the pelvis and small right ovarian cyst.  This ovarian cyst was discussed with her OB/GYN office who felt like this was stable from previous imaging. -Afebrile. No leukocytosis. Renal function normal > Her OB/GYN however did review imaging and noted fluid in the fallopian tube which can at times cause symptoms.  Plan was for her to follow-up with them outpatient for possible elective surgery however patient's pain unable to be controlled in the ED  -Pain became better controlled. Pt tolerated diet and was keen on going home -Advised patient to follow up closely with Ob/GYN      Consultants:  Procedures performed:   Disposition: Home Diet recommendation:  Regular diet DISCHARGE  MEDICATION: Allergies as of 01/17/2022       Reactions   Latex Anaphylaxis   Motrin [ibuprofen] Anaphylaxis   Nsaids Anaphylaxis   Doxycycline Nausea And Vomiting   Sulfa Antibiotics Rash        Medication List     TAKE these medications    B COMPLEX PO Take 1 capsule by mouth daily.   cholecalciferol 25 MCG (1000 UNIT) tablet Commonly known as: VITAMIN D3 Take 1,000 Units by mouth daily.   cyclobenzaprine 5 MG tablet Commonly known as: FLEXERIL Take 5 mg by mouth 3 (three) times daily as needed for muscle spasms.   HYDROcodone-acetaminophen 5-325 MG tablet Commonly known as: NORCO/VICODIN Take 1 tablet by mouth every 6 (six) hours as needed for moderate pain.   IRON HEME POLYPEPTIDE PO Take 1 tablet by mouth daily.   LYSINE HCL PO Take 1 tablet by mouth daily.   MULTIVITAMIN ADULTS PO Take 1 tablet by mouth daily.   senna 8.6 MG Tabs tablet Commonly known as: SENOKOT Take 1 tablet (8.6 mg total) by mouth daily as needed for mild constipation.        Follow-up Information     Follow up with PCP in 1-2 weeks Follow up.   Why: Hospital follow up        Follow up with Ob/GYN Follow up.   Why: Hospital follow up               Discharge Exam: There were no vitals filed for this visit. General exam: Awake, laying in bed, in nad Respiratory system: Normal respiratory effort, no wheezing Cardiovascular  system: regular rate, s1, s2 Gastrointestinal system: Soft, nondistended, positive BS Central nervous system: CN2-12 grossly intact, strength intact Extremities: Perfused, no clubbing Skin: Normal skin turgor, no notable skin lesions seen Psychiatry: Mood normal // no visual hallucinations   Condition at discharge: fair  The results of significant diagnostics from this hospitalization (including imaging, microbiology, ancillary and laboratory) are listed below for reference.   Imaging Studies: US PELVIC COMPLETE W TRANSVAGINAL AND TORSION  R/O  Result Date: 01/16/2022 CLINICAL DATA:  Pelvic pain. EXAM: TRANSABDOMINAL AND TRANSVAGINAL ULTRASOUND OF PELVIS DOPPLER ULTRASOUND OF OVARIES TECHNIQUE: Both transabdominal and transvaginal ultrasound examinations of the pelvis were performed. Transabdominal technique was performed for global imaging of the pelvis including uterus, ovaries, adnexal regions, and pelvic cul-de-sac. It was necessary to proceed with endovaginal exam following the transabdominal exam to visualize the uterus, ovaries and adnexa. Color and duplex Doppler ultrasound was utilized to evaluate blood flow to the ovaries. COMPARISON:  CT earlier today FINDINGS: Uterus Measurements: 13.1 x 6.2 x 6 cm = volume: 254 mL. The uterus is retroverted. Myometrium is heterogeneous. There are 3 small uterine fibroids. Intramural fibroid measures 1.3 x 1.4 cm in the posterior right fundus. Subserosal fibroid measures 1.2 x 1.4 cm in the posterior right fundus. There is a submucosal 0.9 x 1.1 cm fibroid in the anterior right fundus. Endometrium Thickness: 11 mm.  No focal abnormality visualized. Right ovary Measurements: 5 x 2.9 x 3.6 cm = volume: 27.4 mL. Small complex cyst measuring 3.3 x 2.7 x 3 cm with low level internal echoes. There is blood flow to the ovarian parenchyma. No adnexal mass. Left ovary Measurements: 3.1 x 1.1 x 1.9 cm = volume: 3.4 mL. Physiologic with a few small follicles. No dominant cyst or solid lesion. Normal blood flow. No adnexal mass. Pulsed Doppler evaluation of both ovaries demonstrates normal low-resistance arterial and venous waveforms. Other findings Small amount of simple free fluid, as seen on CT. IMPRESSION: 1. Retroverted uterus with small fibroids, at least 1 of which is submucosal. 2. Small complex cyst in the right ovary with low level internal echoes, 3.3 cm greatest dimension. This may represent a hemorrhagic cyst. Given age, recommend follow-up to resolution, pelvic ultrasound in 6-12 weeks recommended. 3.  Normal blood flow to both ovaries. Electronically Signed   By: Narda Rutherford M.D.   On: 01/16/2022 20:39   CT ABDOMEN PELVIS W CONTRAST  Result Date: 01/16/2022 CLINICAL DATA:  Acute generalized abdominal pain. EXAM: CT ABDOMEN AND PELVIS WITH CONTRAST TECHNIQUE: Multidetector CT imaging of the abdomen and pelvis was performed using the standard protocol following bolus administration of intravenous contrast. RADIATION DOSE REDUCTION: This exam was performed according to the departmental dose-optimization program which includes automated exposure control, adjustment of the mA and/or kV according to patient size and/or use of iterative reconstruction technique. CONTRAST:  17mL OMNIPAQUE IOHEXOL 300 MG/ML  SOLN COMPARISON:  None Available. FINDINGS: Lower Chest: No acute findings. Hepatobiliary: Multiple small hepatic cysts are seen in both right and left hepatic lobes. No hepatic masses identified. Gallbladder is unremarkable. No evidence of biliary ductal dilatation. Pancreas:  No mass or inflammatory changes. Spleen: Within normal limits in size and appearance. Adrenals/Urinary Tract: No masses identified. No evidence of ureteral calculi or hydronephrosis. Unremarkable unopacified urinary bladder. Stomach/Bowel: No evidence of obstruction, inflammatory process or abnormal fluid collections. Normal appendix visualized. Vascular/Lymphatic: No pathologically enlarged lymph nodes. No acute vascular findings. Reproductive: No masses identified. Small amount of free fluid seen in pelvic cul-de-sac is  nonspecific, but likely physiologic. Other:  None. Musculoskeletal:  No suspicious bone lesions identified. IMPRESSION: Small amount of free fluid in pelvic cul-de-sac, which is nonspecific but likely physiologic. Otherwise unremarkable exam. Electronically Signed   By: Danae Orleans M.D.   On: 01/16/2022 19:05    Microbiology: No results found for this or any previous visit.  Labs: CBC: Recent Labs  Lab  01/16/22 1550 01/16/22 2357 01/17/22 0352  WBC 7.8 11.2* 8.5  HGB 12.9 12.5 12.8  HCT 39.3 38.8 40.1  MCV 82.6 83.8 84.2  PLT 309 304 294   Basic Metabolic Panel: Recent Labs  Lab 01/16/22 1550 01/16/22 2357 01/17/22 0352  NA 137  --  137  K 4.0  --  3.9  CL 101  --  101  CO2 24  --  25  GLUCOSE 108*  --  98  BUN 12  --  7  CREATININE 0.67 0.57 0.65  CALCIUM 9.4  --  9.0   Liver Function Tests: Recent Labs  Lab 01/16/22 1550 01/17/22 0352  AST 24 20  ALT 18 19  ALKPHOS 50 51  BILITOT 0.6 0.6  PROT 6.9 6.4*  ALBUMIN 3.9 3.7   CBG: No results for input(s): "GLUCAP" in the last 168 hours.  Discharge time spent: less than 30 minutes.  Signed: Rickey Barbara, MD [facility] 01/17/2022

## 2022-01-17 NOTE — ED Notes (Signed)
Doctor at bedside.

## 2022-06-30 ENCOUNTER — Other Ambulatory Visit: Payer: Self-pay | Admitting: Obstetrics and Gynecology

## 2022-06-30 DIAGNOSIS — Z1231 Encounter for screening mammogram for malignant neoplasm of breast: Secondary | ICD-10-CM

## 2022-07-21 ENCOUNTER — Other Ambulatory Visit: Payer: Self-pay | Admitting: Internal Medicine

## 2022-07-21 DIAGNOSIS — R053 Chronic cough: Secondary | ICD-10-CM

## 2022-08-01 IMAGING — MG MM DIGITAL SCREENING BILAT W/ TOMO AND CAD
8 series · 9 of 24 positions shown · non-contrast
Comparison: Previous exam(s).

CLINICAL DATA: Screening.

EXAM:
DIGITAL SCREENING BILATERAL MAMMOGRAM WITH TOMOSYNTHESIS AND CAD
TECHNIQUE: Bilateral screening digital craniocaudal and mediolateral oblique
mammograms were obtained. Bilateral screening digital breast
tomosynthesis was performed. The images were evaluated with
computer-aided detection.

[L MLO synth-2D]
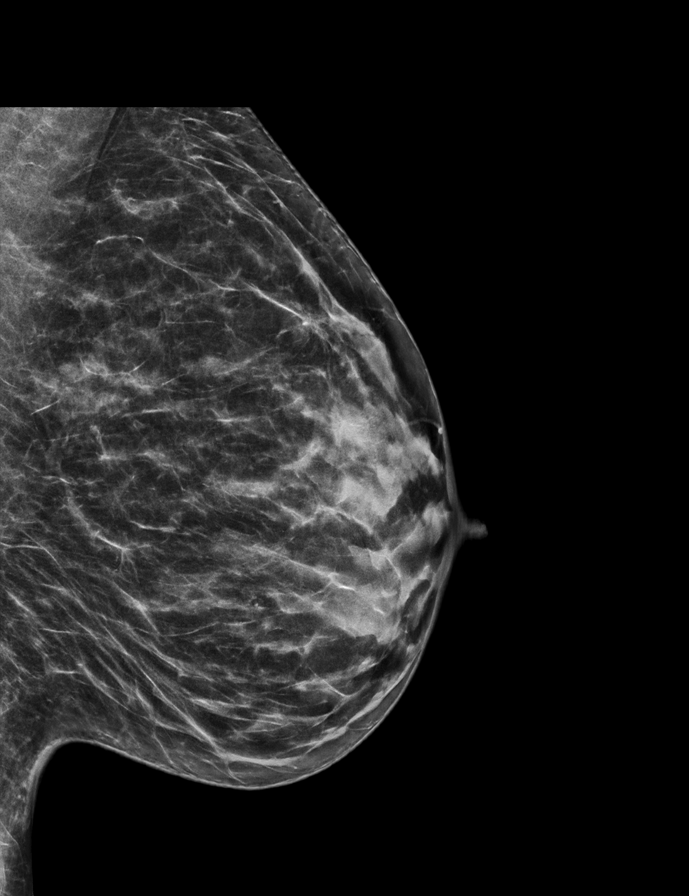

[R MLO synth-2D]
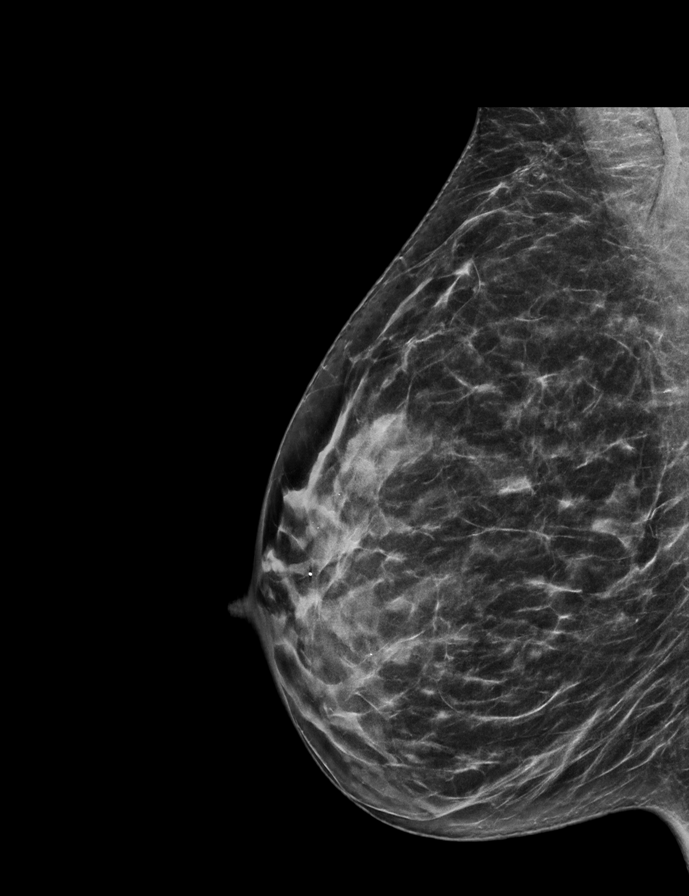

[R CC synth-2D]
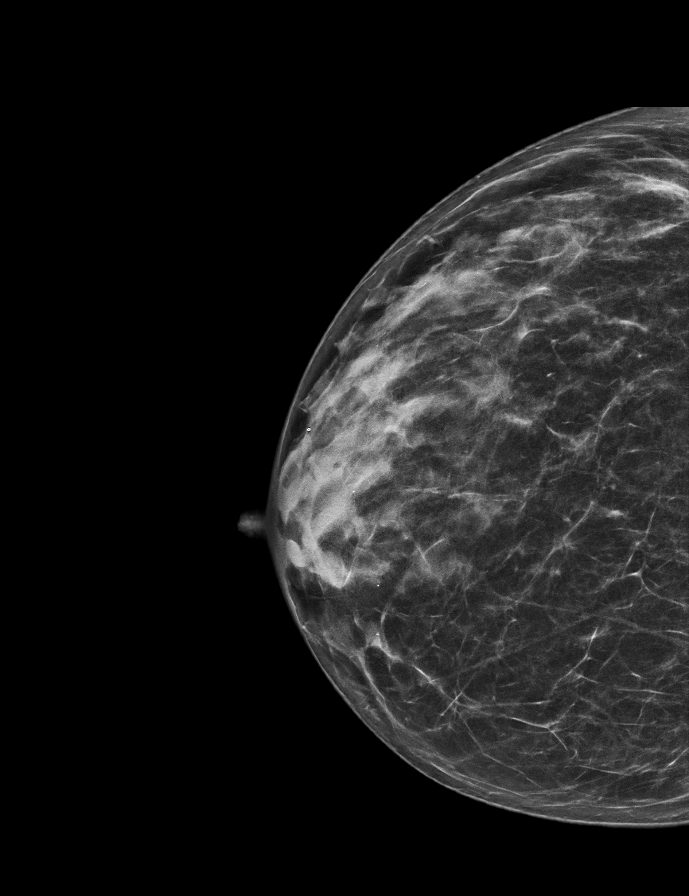

[L CC synth-2D]
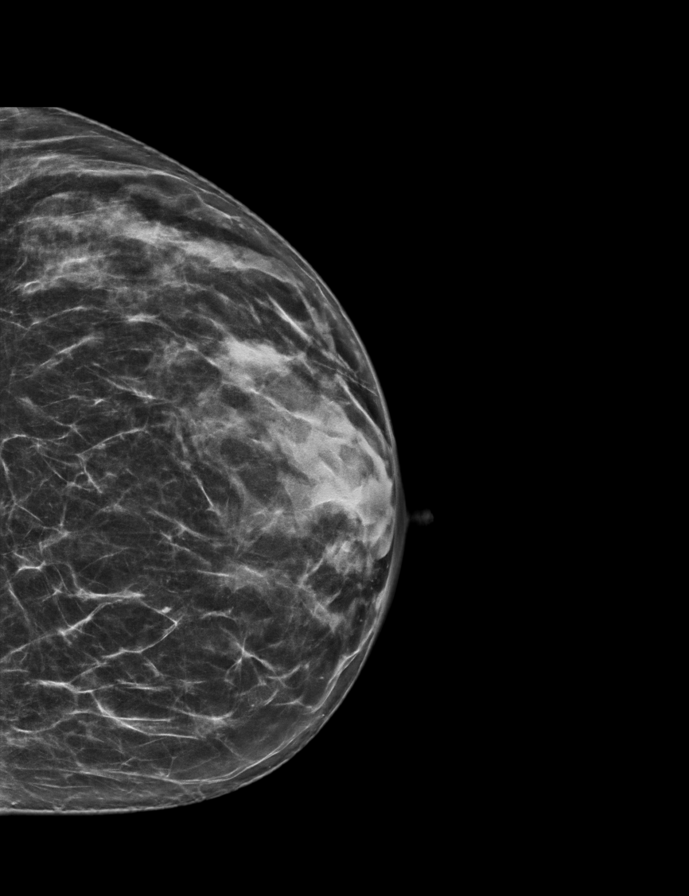

[L MLO tomo · 2 of 59 frames shown]
[frame 20/59]
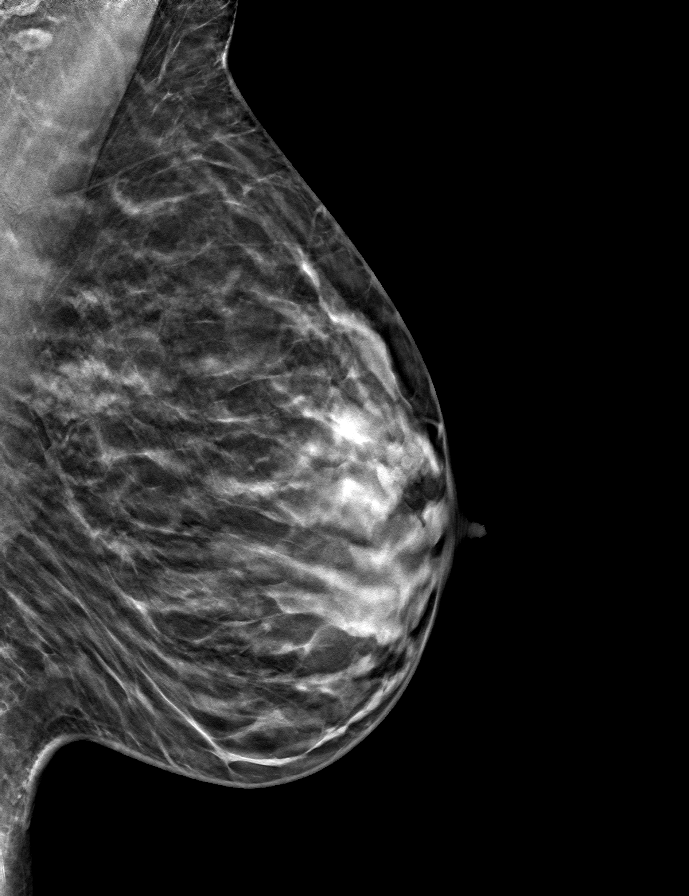
[frame 30/59]
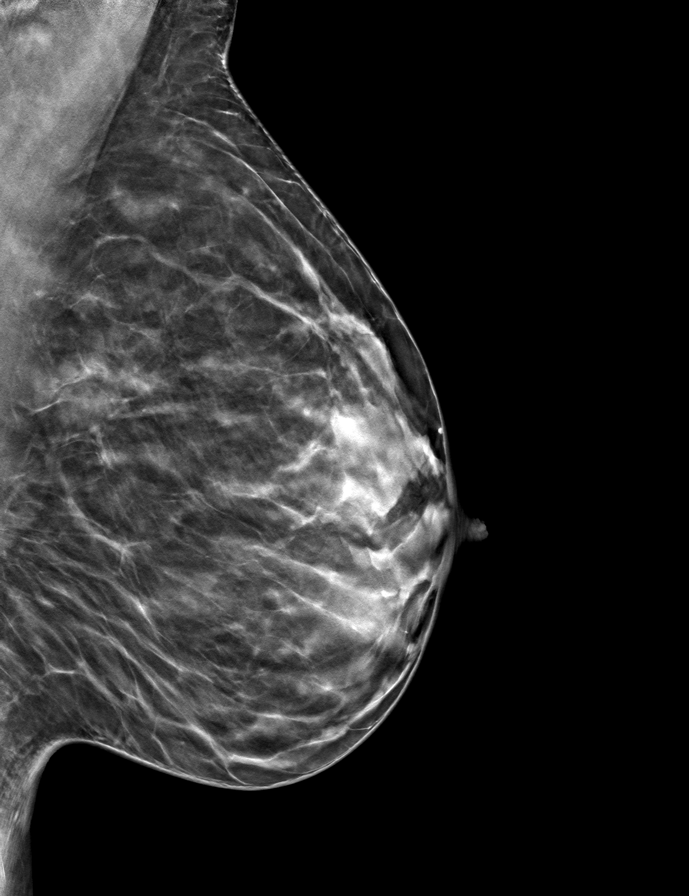

[R CC tomo · tomo slice 28/55.0]
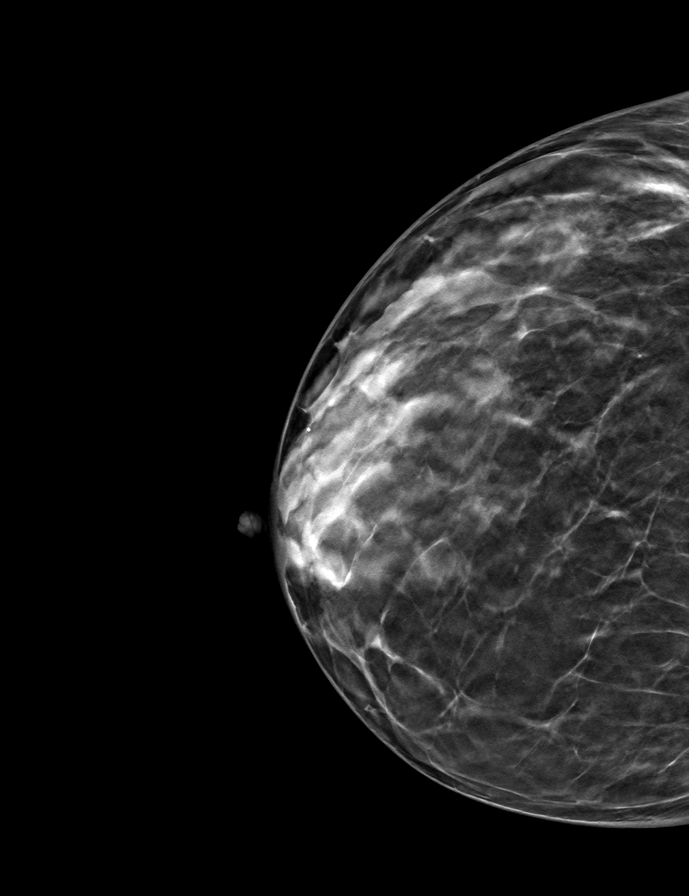

[L CC tomo · tomo slice 31/61.0]
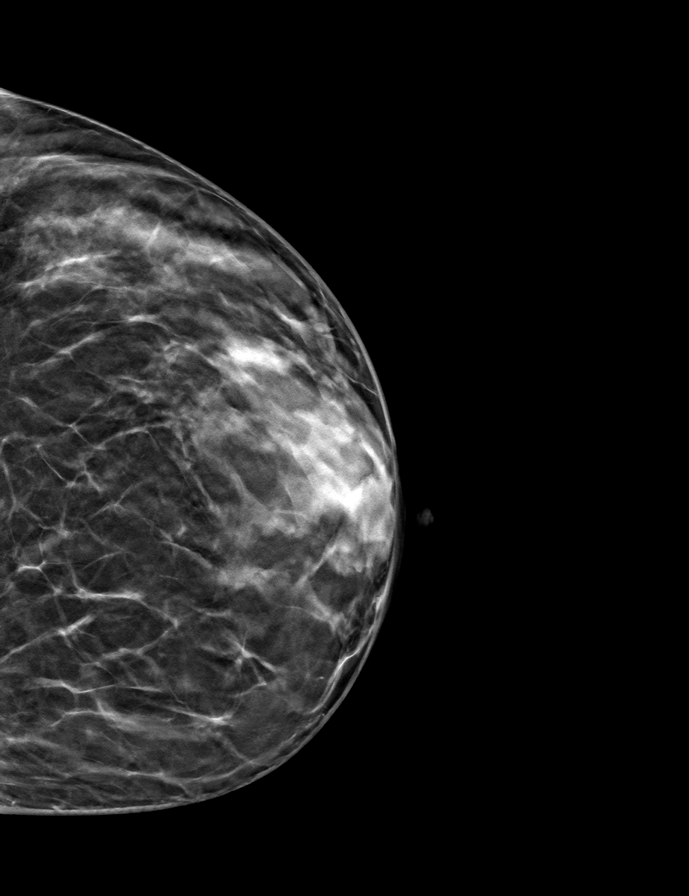

[R MLO tomo · tomo slice 31/60.0]
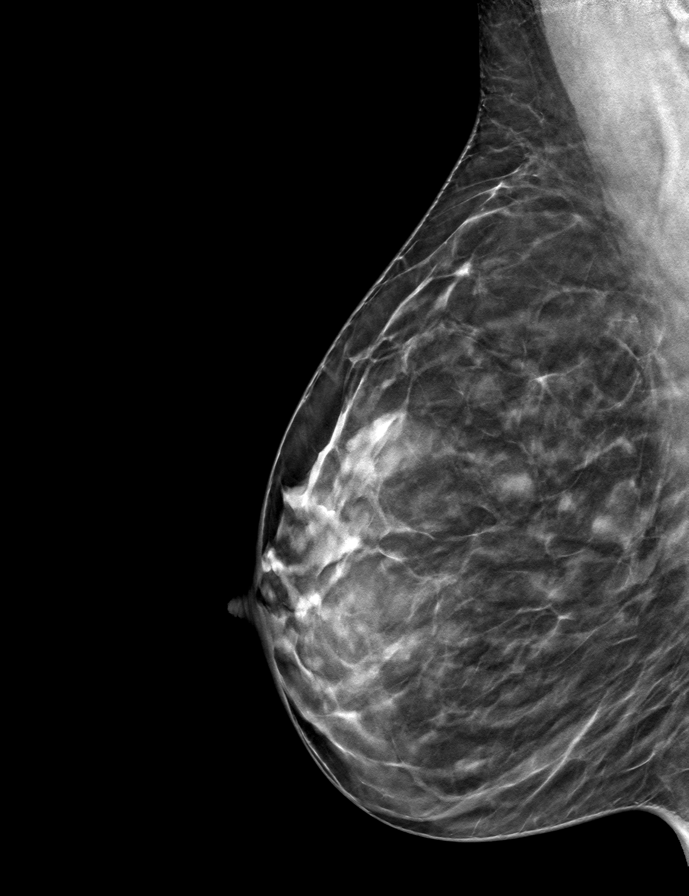

[9 of 24 positions shown; findings below may reference images not displayed]

ACR Breast Density Category c: The breast tissue is heterogeneously
dense, which may obscure small masses.
FINDINGS: There are no findings suspicious for malignancy. The images were
evaluated with computer-aided detection.
IMPRESSION: No mammographic evidence of malignancy. A result letter of this
screening mammogram will be mailed directly to the patient.

RECOMMENDATION:
Screening mammogram in one year. (Code:T4-5-GWO)

BI-RADS CATEGORY  1: Negative.

## 2022-08-20 ENCOUNTER — Ambulatory Visit: Payer: PRIVATE HEALTH INSURANCE

## 2022-08-23 ENCOUNTER — Other Ambulatory Visit: Payer: Commercial Managed Care - HMO

## 2022-09-01 ENCOUNTER — Ambulatory Visit
Admission: RE | Admit: 2022-09-01 | Discharge: 2022-09-01 | Disposition: A | Discharge status: Home / Self Care | Payer: Commercial Managed Care - HMO | Source: Ambulatory Visit | Attending: Obstetrics and Gynecology | Admitting: Obstetrics and Gynecology

## 2022-09-01 DIAGNOSIS — Z1231 Encounter for screening mammogram for malignant neoplasm of breast: Secondary | ICD-10-CM

## 2022-09-13 ENCOUNTER — Ambulatory Visit
Admission: RE | Admit: 2022-09-13 | Discharge: 2022-09-13 | Disposition: A | Discharge status: Home / Self Care | Payer: Commercial Managed Care - HMO | Source: Ambulatory Visit | Attending: Internal Medicine | Admitting: Internal Medicine

## 2022-09-13 DIAGNOSIS — R053 Chronic cough: Secondary | ICD-10-CM

## 2022-11-02 NOTE — Progress Notes (Unsigned)
[facility] [phone]   [phone]  CONSULT NOTE  REFERRING PHYSICIAN: Dr. Billy Coast   REASON FOR CONSULTATION:  Iron Deficiency   HPI Helvi Royals is a 52 y.o. female with a past medical history signal for fibroids, chronic joint pain, IBS, and allergies is referred to the clinic for iron deficiency.  The patient recently had a appointment with her GYN. She had lab work performed on 11/01/2022 which showed normal hemoglobin at 13.3, normal white blood cell count of 4.3, normal platelet count at 338.  Her iron studies showed slightly low iron saturation 12%, and borderline low ferritin at 16.  Her TIBC was normal at 344 and her iron was low end of normal at 40. Her B12 was normal at 923.  She is referred to the clinic regarding these findings.  Patient reports that she has had iron deficiency without anemia for a long time.  However she reports she is feeling symptomatic from this with fatigue, palpitations, and decreased activity tolerance.  She has been on iron supplements for several years.  She is currently taking a iron polysaccharide which is 20 mg.  She is also a vegetarian.  However, she does eat other foods rich in iron besides meat.  Of note, the patient all other routine labs performed including her thyroid.  Her TSH was slightly above normal but they are planning on rechecking this in a few months.  She reports they attributed this to hormone replacement therapy.  She continues to have menstrual cycles monthly lasting 5 days.  She states the first 2 days are heavy.   The oldest records I have are from 2012. She had mild anemia at that time. Otherwise, I have labs from 2023 without any anemia.  She has never required a blood transfusion or iron infusion.   . She denies any gingival bleeding, epistaxis, hemoptysis, hematemesis, or melena.  She performed a Cologuard test recently which was negative.   She denies any fever, chills, night  sweats, or unexplained weight loss.She does not take NSAID because she is allergic.  She does not take any blood thinners. She does not crave ice chips presently but had in the past.  Denies any history of bariatric surgery but she does have IBS and was told in the past she may not absorb iron well due to her IBS.  He has a family history for malignancy in her mother and father.  Her father also had iron deficiency secondary to blood thinners.  Her brother had hypertension and hyperlipidemia.   HPI  Past Medical History:  Diagnosis Date   Anxiety    Asthma    Chronic arthritis    Dizzy spells    Esophagitis    Food allergy    Cherries, Peaches, Plums, Apples, Almonds ( Foods related to the [place] tree)   Gastritis    Hiatal hernia    Syncope and collapse     Past Surgical History:  Procedure Laterality Date   ADENOIDECTOMY     KNEE SURGERY     left   SHOULDER SURGERY     right   TONSILLECTOMY      Family History  Problem Relation Age of Onset   Kidney disease Other        family hx of   Hypertension Other        family hx of   Diabetes Other        family hx of   Liver disease  Other        family hx of   Breast cancer Mother    Lung cancer Mother    Melanoma Father    High blood pressure Father    High blood pressure Brother    Asthma Son    Asthma Son    Breast cancer Maternal Aunt     Social History Social History   Tobacco Use   Smokeless tobacco: Never  Substance Use Topics   Alcohol use: No   Drug use: No    Allergies  Allergen Reactions   Latex Anaphylaxis   Motrin [Ibuprofen] Anaphylaxis   Nsaids Anaphylaxis   Other Anaphylaxis    Peaches, Cherries, Plums, Almonds, Apples   Sulfamethoxazole Rash and Swelling   Latex Itching and Swelling   Sulfa Antibiotics Rash    Current Outpatient Medications  Medication Sig Dispense Refill   FeFum-FePoly-FA-B Cmp-C-Biot (INTEGRA PLUS) CAPS Take 1 capsule by mouth every morning. 30 capsule 2    cholecalciferol (VITAMIN D3) 25 MCG (1000 UNIT) tablet Take 1,000 Units by mouth daily.     cyclobenzaprine (FLEXERIL) 5 MG tablet Take 5 mg by mouth 3 (three) times daily as needed for muscle spasms.     EPINEPHrine 0.3 mg/0.3 mL IJ SOAJ injection Inject into the muscle.     HYDROcodone-acetaminophen (NORCO/VICODIN) 5-325 MG tablet Take 1 tablet by mouth every 6 (six) hours as needed for moderate pain.     Multiple Vitamins-Minerals (MULTIVITAMIN ADULTS PO) Take 1 tablet by mouth daily.     No current facility-administered medications for this visit.    REVIEW OF SYSTEMS:   Review of Systems  Constitutional: Positive for fatigue.  Negative for appetite change, chills, fever and unexpected weight change.  HENT: Negative for mouth sores, nosebleeds, sore throat and trouble swallowing.   Eyes: Negative for eye problems and icterus.  Respiratory: negative for cough, hemoptysis, shortness of breath and wheezing.   Cardiovascular: Positive for occasional palpitations.  Negative for chest pain and leg swelling.  Gastrointestinal: Negative for abdominal pain, constipation, diarrhea, nausea and vomiting.  Genitourinary: Negative for bladder incontinence, difficulty urinating, dysuria, frequency and hematuria.   Musculoskeletal: Negative for back pain, gait problem, neck pain and neck stiffness.  Skin: Negative for itching and rash.  Neurological: Negative for dizziness, extremity weakness, gait problem, headaches, light-headedness and seizures.  Hematological: Negative for adenopathy. Does not bruise/bleed easily.  Psychiatric/Behavioral: Negative for confusion, depression and sleep disturbance. The patient is not nervous/anxious.     PHYSICAL EXAMINATION:  Blood pressure 126/77, pulse 74, temperature 98.3 F (36.8 C), resp. rate 20, weight 128 lb 9.6 oz (58.3 kg), SpO2 100 %.  ECOG PERFORMANCE STATUS: 1  Physical Exam  Constitutional: Oriented to person, place, and time and well-developed,  well-nourished, and in no distress.  HENT:  Head: Normocephalic and atraumatic.  Mouth/Throat: Oropharynx is clear and moist. No oropharyngeal exudate.  Eyes: Conjunctivae are normal. Right eye exhibits no discharge. Left eye exhibits no discharge. No scleral icterus.  Neck: Normal range of motion. Neck supple.  Cardiovascular: Normal rate, regular rhythm, normal heart sounds and intact distal pulses.   Pulmonary/Chest: Effort normal and breath sounds normal. No respiratory distress. No wheezes. No rales.  Abdominal: Soft. Bowel sounds are normal. Exhibits no distension and no mass. There is no tenderness.  Musculoskeletal: Normal range of motion. Exhibits no edema.  Lymphadenopathy:    No cervical adenopathy.  Neurological: Alert and oriented to person, place, and time. Exhibits normal muscle tone. Gait normal.  Coordination normal.  Skin: Skin is warm and dry. No rash noted. Not diaphoretic. No erythema. No pallor.  Psychiatric: Mood, memory and judgment normal.  Vitals reviewed.  LABORATORY DATA: Lab Results  Component Value Date   WBC 8.5 01/17/2022   HGB 12.8 01/17/2022   HCT 40.1 01/17/2022   MCV 84.2 01/17/2022   PLT 294 01/17/2022      Chemistry      Component Value Date/Time   NA 137 01/17/2022 0352   K 3.9 01/17/2022 0352   CL 101 01/17/2022 0352   CO2 25 01/17/2022 0352   BUN 7 01/17/2022 0352   CREATININE 0.65 01/17/2022 0352      Component Value Date/Time   CALCIUM 9.0 01/17/2022 0352   ALKPHOS 51 01/17/2022 0352   AST 20 01/17/2022 0352   ALT 19 01/17/2022 0352   BILITOT 0.6 01/17/2022 0352       RADIOGRAPHIC STUDIES: No results found.  ASSESSMENT: This is a very pleasant 52 year old female with iron deficiency without anemia.  Patient had lab work done a few days ago which showed normal hemoglobin at 13.3, low end of normal ferritin at 16, low end of normal iron at 40, and low saturation 12%.  Dr. Avanell Shackleton lengthy discussion with the patient today  about her current condition and recommendations.  We discussed several options today.  Because the patient is reportedly symptomatic from her iron deficiency, we will arrange for IV iron with Feraheme.  She is concerned about an allergy so we will premedicate with Tylenol and Benadryl.  I also provided information on her after visit summary for Feraheme.  Discussed with the patient that her insurance may prefer alternative iron.  She states that she would pay out-of-pocket.  I gave her the number to the [facility] to call them to schedule her iron infusion should there be delays in them calling her due to insurance concerns.  Will also change her iron supplement to Integra plus to take 1 tablet p.o. daily with vitamin C.  We will then see her back for follow-up visit in 3 months for evaluation repeat CBC, iron studies, ferritin.  She is scheduled to see her PCP later today.   The patient voices understanding of current disease status and treatment options and is in agreement with the current care plan.  All questions were answered. The patient knows to call the clinic with any problems, questions or concerns. We can certainly see the patient much sooner if necessary.  Thank you so much for allowing me to participate in the care of Angelicia Lessner. I will continue to follow up the patient with you and assist in her care.  Disclaimer: This note was dictated with voice recognition software. Similar sounding words can inadvertently be transcribed and may not be corrected upon review.   Rhiannan Kievit L Chani Ghanem Nov 04, 2022, 2:21 PM  ADDENDUM: Hematology/Oncology Attending: I had a face-to-face encounter with the patient today.  I reviewed her records, lab and recommended her care plan.  This is a very pleasant 52 years old white female with history of uterine fibroids, chronic arthralgia as well as IBS and allergies.  She was seen recently by her gynecologist and during her  blood work she was found to have normal hemoglobin but her iron study was on the low normal with iron saturation of 12% and ferritin of 16.  Her serum iron was 40 and she had normal B12 level of 923.  The patient has a history  of iron deficiency anemia for a while.  She has been complaining of increasing fatigue and weakness.  She tried over-the-counter iron polysaccharide 20 mg which was not effective.  She is vegetarian and had diet restriction and she does not eat red meat.  She has a slightly elevated TSH consistent with hypothyroidism. She continues to have menstrual cycles which usually last around 5 days with 2 heavy days. She has some interest in evaluation for consideration of IV iron and she was referred to [place] for discussion of this option. I had a lengthy discussion with the patient today about her condition and treatment options. I gave the patient the option of proceeding with iron infusion with either Feraheme or Ferrlecit depending on her insurance coverage.  Will also change her oral iron tablet to Integra +1 capsule p.o. daily.  We will see the patient back for follow-up visit in around 3 months for evaluation and repeat blood work including CBC, iron study and ferritin. She was advised to call immediately if she has any other concerning symptoms in the interval. The total time spent in the appointment was 60 minutes. Disclaimer: This note was dictated with voice recognition software. Similar sounding words can inadvertently be transcribed and may be missed upon review. Lajuana Matte, MD

## 2022-11-03 ENCOUNTER — Other Ambulatory Visit: Payer: Self-pay | Admitting: Physician Assistant

## 2022-11-03 DIAGNOSIS — D509 Iron deficiency anemia, unspecified: Secondary | ICD-10-CM

## 2022-11-04 ENCOUNTER — Other Ambulatory Visit: Payer: Self-pay

## 2022-11-04 ENCOUNTER — Inpatient Hospital Stay: Payer: Commercial Managed Care - HMO | Attending: Physician Assistant | Admitting: Physician Assistant

## 2022-11-04 ENCOUNTER — Inpatient Hospital Stay: Payer: Commercial Managed Care - HMO

## 2022-11-04 VITALS — BP 126/77 | HR 74 | Temp 98.3°F | Resp 20 | Wt 128.6 lb

## 2022-11-04 DIAGNOSIS — G8929 Other chronic pain: Secondary | ICD-10-CM | POA: Insufficient documentation

## 2022-11-04 DIAGNOSIS — D509 Iron deficiency anemia, unspecified: Secondary | ICD-10-CM

## 2022-11-04 DIAGNOSIS — K589 Irritable bowel syndrome without diarrhea: Secondary | ICD-10-CM | POA: Insufficient documentation

## 2022-11-04 DIAGNOSIS — E611 Iron deficiency: Secondary | ICD-10-CM | POA: Insufficient documentation

## 2022-11-04 DIAGNOSIS — Z86018 Personal history of other benign neoplasm: Secondary | ICD-10-CM | POA: Insufficient documentation

## 2022-11-04 MED ORDER — INTEGRA PLUS PO CAPS: 1.0000 | ORAL_CAPSULE | Freq: Every morning | ORAL | 2 refills | Status: DC

## 2022-11-07 ENCOUNTER — Encounter: Payer: Self-pay | Admitting: Internal Medicine

## 2022-11-09 ENCOUNTER — Other Ambulatory Visit: Payer: Self-pay | Admitting: Physician Assistant

## 2022-11-09 ENCOUNTER — Telehealth: Payer: Self-pay | Admitting: Internal Medicine

## 2022-11-09 ENCOUNTER — Telehealth: Payer: Self-pay

## 2022-11-09 NOTE — Telephone Encounter (Signed)
Contacted patient to scheduled appointments. Patient is aware of appointments that are scheduled.   

## 2022-11-09 NOTE — Telephone Encounter (Signed)
This nurse reached out to patient to verify where she would like to receive her iron infusions.  Patient requests to have them scheduled with [facility].  States that she would rather be close to the hospital due to her history of Anaphylaxis. This nurse acknowledged understanding and advised that our schedulers will reach out to her.  No further questions or concerns noted at this time.

## 2022-11-13 ENCOUNTER — Inpatient Hospital Stay: Payer: Commercial Managed Care - HMO

## 2022-11-13 VITALS — BP 129/71 | HR 69 | Temp 98.4°F | Resp 18

## 2022-11-13 DIAGNOSIS — E611 Iron deficiency: Secondary | ICD-10-CM | POA: Diagnosis present

## 2022-11-13 DIAGNOSIS — G8929 Other chronic pain: Secondary | ICD-10-CM | POA: Diagnosis not present

## 2022-11-13 DIAGNOSIS — K589 Irritable bowel syndrome without diarrhea: Secondary | ICD-10-CM | POA: Diagnosis not present

## 2022-11-13 DIAGNOSIS — Z86018 Personal history of other benign neoplasm: Secondary | ICD-10-CM | POA: Diagnosis not present

## 2022-11-13 DIAGNOSIS — D509 Iron deficiency anemia, unspecified: Secondary | ICD-10-CM

## 2022-11-13 MED ORDER — FAMOTIDINE 20 MG IN NS 100 ML IVPB
20.0000 mg | Freq: Once | INTRAVENOUS | Status: AC | PRN
  Administered 2022-11-13: 20 mg via INTRAVENOUS

## 2022-11-13 MED ORDER — ACETAMINOPHEN 325 MG PO TABS
650.0000 mg | ORAL_TABLET | Freq: Once | ORAL | Status: AC
  Administered 2022-11-13: 650 mg via ORAL
  Filled 2022-11-13: qty 2

## 2022-11-13 MED ORDER — METHYLPREDNISOLONE SODIUM SUCC 125 MG IJ SOLR
125.0000 mg | Freq: Once | INTRAMUSCULAR | Status: AC | PRN
  Administered 2022-11-13: 125 mg via INTRAVENOUS

## 2022-11-13 MED ORDER — DIPHENHYDRAMINE HCL 25 MG PO CAPS
50.0000 mg | ORAL_CAPSULE | Freq: Once | ORAL | Status: AC
  Administered 2022-11-13: 25 mg via ORAL
  Filled 2022-11-13: qty 2

## 2022-11-13 MED ORDER — SODIUM CHLORIDE 0.9 % IV SOLN: Freq: Once | INTRAVENOUS | Status: AC

## 2022-11-13 MED ORDER — SODIUM CHLORIDE 0.9 % IV SOLN
510.0000 mg | Freq: Once | INTRAVENOUS | Status: AC
  Administered 2022-11-13: 510 mg via INTRAVENOUS
  Filled 2022-11-13: qty 510

## 2022-11-13 MED ORDER — SODIUM CHLORIDE 0.9 % IV SOLN: Freq: Once | INTRAVENOUS | Status: DC | PRN

## 2022-11-13 NOTE — Patient Instructions (Signed)

## 2022-11-13 NOTE — Progress Notes (Signed)
Hypersensitivity Reaction note  Date of event: 11/13/22 Time of event: 0846 Generic name of drug involved: Feraheme Name of provider notified of the hypersensitivity reaction: Dr. Candise Che and Dr. Al Pimple Was agent that likely caused hypersensitivity reaction added to Allergies List within EMR? Yes Chain of events including reaction signs/symptoms, treatment administered, and outcome (e.g., drug resumed; drug discontinued; sent to Emergency Department; etc.) [phone]: Patient reported itching throat and tongue, left sided facial swelling, and shortness of breath. Feraheme stopped. 0848: Temp 98.4 HR 72 O2 100% BP 127/89 0850: 1 L NS administered @999ml /hr. Pepcid 20 mg administered.  1914: Dr. Al Pimple made aware. Orders for 125 mg Solumedrol.  7829: 125 mg Solumedrol administered. BP 127/89 HR 67 O2 100%. 5621: Patient reports complete relief of shortness of breath and throat and tongue itching. Left sided facial swelling has improved. Patient states in previous drug reactions the facial swelling lasts a few days.  3086: Dr. Candise Che made aware of situation and patients relief of symptoms. Dr. Candise Che stated ok for discharge and instructed patient to use zyrtec or claritin, pepcid, and benadryl as needed for the next few days and to report to Emergency Department for any worsening symptoms. Patient verbalized understanding.  1017: Patient discharged. Ambulatory and VSS at discharge. Patient reports relief of symptoms. Educated to report to Emergency Department if symptoms return. Patient verbalized understanding.   Posey Rea, RN 11/13/2022 9:58 AM

## 2022-11-15 ENCOUNTER — Encounter: Payer: Self-pay | Admitting: Medical Oncology

## 2022-11-15 ENCOUNTER — Telehealth: Payer: Self-pay

## 2022-11-15 NOTE — Telephone Encounter (Signed)
This nurse called patient to follow up on 11/13/2022 feraheme reaction. Patient states that she is feeling back to baseline but "just feels heavy from all the medication." Patient reports complete relief of facial swelling and no respiratory symptoms. Patient educated to call cancer center for any additional concerns or questions.

## 2022-11-16 ENCOUNTER — Encounter: Payer: Self-pay | Admitting: Medical Oncology

## 2022-11-17 ENCOUNTER — Telehealth: Payer: Self-pay | Admitting: Internal Medicine

## 2022-11-18 ENCOUNTER — Other Ambulatory Visit: Payer: Self-pay | Admitting: Medical Oncology

## 2022-11-18 ENCOUNTER — Telehealth: Payer: Self-pay

## 2022-11-18 NOTE — Telephone Encounter (Signed)
This nurse reached out to patient related to her scheduled appointments.  Patient is requesting to see the provider sooner then 6/5.  This nurse offered an afternoon appointment on 5/23 and the patient is in agreement.  No further questions or concerns noted at this time.

## 2022-11-18 NOTE — Progress Notes (Signed)
Labs ordered.

## 2022-11-20 ENCOUNTER — Inpatient Hospital Stay: Payer: Commercial Managed Care - HMO

## 2022-11-25 ENCOUNTER — Other Ambulatory Visit: Payer: Self-pay

## 2022-11-25 ENCOUNTER — Other Ambulatory Visit: Payer: Self-pay | Admitting: Internal Medicine

## 2022-11-25 ENCOUNTER — Inpatient Hospital Stay: Payer: Commercial Managed Care - HMO

## 2022-11-25 ENCOUNTER — Inpatient Hospital Stay: Payer: Commercial Managed Care - HMO | Admitting: Internal Medicine

## 2022-11-25 VITALS — BP 130/91 | HR 72 | Temp 98.4°F | Resp 15 | Wt 130.4 lb

## 2022-11-25 DIAGNOSIS — E611 Iron deficiency: Secondary | ICD-10-CM | POA: Diagnosis not present

## 2022-11-25 DIAGNOSIS — D509 Iron deficiency anemia, unspecified: Secondary | ICD-10-CM

## 2022-11-25 DIAGNOSIS — D508 Other iron deficiency anemias: Secondary | ICD-10-CM

## 2022-11-25 LAB — CBC WITH DIFFERENTIAL (CANCER CENTER ONLY)
Abs Immature Granulocytes: 0.02 10*3/uL (ref 0.00–0.07)
Basophils Absolute: 0 10*3/uL (ref 0.0–0.1)
Basophils Relative: 0 %
Eosinophils Absolute: 0.1 10*3/uL (ref 0.0–0.5)
Eosinophils Relative: 2 %
HCT: 42.6 % (ref 36.0–46.0)
Hemoglobin: 14 g/dL (ref 12.0–15.0)
Immature Granulocytes: 0 %
Lymphocytes Relative: 31 %
Lymphs Abs: 2.2 10*3/uL (ref 0.7–4.0)
MCH: 28 pg (ref 26.0–34.0)
MCHC: 32.9 g/dL (ref 30.0–36.0)
MCV: 85.2 fL (ref 80.0–100.0)
Monocytes Absolute: 0.4 10*3/uL (ref 0.1–1.0)
Monocytes Relative: 6 %
Neutro Abs: 4.4 10*3/uL (ref 1.7–7.7)
Neutrophils Relative %: 61 %
Platelet Count: 290 10*3/uL (ref 150–400)
RBC: 5 MIL/uL (ref 3.87–5.11)
RDW: 15.1 % (ref 11.5–15.5)
WBC Count: 7.1 10*3/uL (ref 4.0–10.5)
nRBC: 0 % (ref 0.0–0.2)

## 2022-11-25 LAB — IRON AND IRON BINDING CAPACITY (CC-WL,HP ONLY)
Iron: 50 ug/dL (ref 28–170)
Saturation Ratios: 15 % (ref 10.4–31.8)
TIBC: 326 ug/dL (ref 250–450)
UIBC: 276 ug/dL (ref 148–442)

## 2022-11-25 LAB — FOLATE: Folate: 23.3 ng/mL (ref >5.9)

## 2022-11-25 LAB — VITAMIN B12: Vitamin B-12: 760 pg/mL (ref 180–914)

## 2022-11-25 NOTE — Progress Notes (Signed)
[facility] Telephone:([phone]   Fax:([phone]) [phone]  OFFICE PROGRESS NOTE  Pcp, No No address on file  DIAGNOSIS: History of iron deficiency.  PRIOR THERAPY: Iron infusion with Feraheme 510 Mg IV x 1 complicated with anaphylactic reaction.  CURRENT THERAPY: Integra +1 capsule p.o. daily.  INTERVAL HISTORY: Tracey Proctor 52 y.o. female returns to the clinic today for follow-up visit.  The patient continues to complain of fatigue despite having no evidence of anemia.  She also going through some postmenopausal hormonal changes recently.  She is supposed to see a hormonal specialist soon for evaluation of her condition.  She denied having any current chest pain, shortness of breath, cough or hemoptysis.  She has no nausea, vomiting, diarrhea or constipation.  She has no headache or visual changes.  She used to drink a lot of sweet tea on daily basis and this may have affected the absorption of her iron.  She received iron infusion with Feraheme for 1 dose but unfortunately she had allergic anaphylactic reaction to it and she was treated with Pepcid IV fluid, and Solu-Medrol.  She felt much better later on with resolution of her symptoms.  She is here today for evaluation and repeat blood work and discussion of her condition.  MEDICAL HISTORY: Past Medical History:  Diagnosis Date   Anxiety    Asthma    Chronic arthritis    Dizzy spells    Esophagitis    Food allergy    Cherries, Peaches, Plums, Apples, Almonds ( Foods related to the Jerome tree)   Gastritis    Hiatal hernia    Syncope and collapse     ALLERGIES:  is allergic to feraheme [ferumoxytol], latex, motrin [ibuprofen], nsaids, other, sulfamethoxazole, latex, and sulfa antibiotics.  MEDICATIONS:  Current Outpatient Medications  Medication Sig Dispense Refill   cholecalciferol (VITAMIN D3) 25 MCG (1000 UNIT) tablet Take 1,000 Units by mouth daily.     cyclobenzaprine (FLEXERIL) 5 MG tablet Take 5  mg by mouth 3 (three) times daily as needed for muscle spasms.     EPINEPHrine 0.3 mg/0.3 mL IJ SOAJ injection Inject into the muscle.     estradiol (VIVELLE-DOT) 0.05 MG/24HR patch 1 patch 2 (two) times a week.     FeFum-FePoly-FA-B Cmp-C-Biot (INTEGRA PLUS) CAPS Take 1 capsule by mouth every morning. 30 capsule 2   HYDROcodone-acetaminophen (NORCO/VICODIN) 5-325 MG tablet Take 1 tablet by mouth every 6 (six) hours as needed for moderate pain.     hyoscyamine (ANASPAZ) 0.125 MG TBDP disintergrating tablet Take 0.25 mg by mouth 2 (two) times daily.     Multiple Vitamins-Minerals (MULTIVITAMIN ADULTS PO) Take 1 tablet by mouth daily.     No current facility-administered medications for this visit.    SURGICAL HISTORY:  Past Surgical History:  Procedure Laterality Date   ADENOIDECTOMY     KNEE SURGERY     left   SHOULDER SURGERY     right   TONSILLECTOMY      REVIEW OF SYSTEMS:  Constitutional: positive for fatigue Eyes: negative Ears, nose, mouth, throat, and face: negative Respiratory: negative Cardiovascular: negative Gastrointestinal: negative Genitourinary:negative Integument/breast: negative Hematologic/lymphatic: negative Musculoskeletal:negative Neurological: negative Behavioral/Psych: negative Endocrine: negative Allergic/Immunologic: negative   PHYSICAL EXAMINATION: General appearance: alert, cooperative, fatigued, and no distress Head: Normocephalic, without obvious abnormality, atraumatic Neck: no adenopathy, no JVD, supple, symmetrical, trachea midline, and thyroid not enlarged, symmetric, no tenderness/mass/nodules Lymph nodes: Cervical, supraclavicular, and axillary nodes normal. Resp: clear to  auscultation bilaterally Back: symmetric, no curvature. ROM normal. No CVA tenderness. Cardio: regular rate and rhythm, S1, S2 normal, no murmur, click, rub or gallop GI: soft, non-tender; bowel sounds normal; no masses,  no organomegaly Extremities: extremities  normal, atraumatic, no cyanosis or edema Neurologic: Alert and oriented X 3, normal strength and tone. Normal symmetric reflexes. Normal coordination and gait  ECOG PERFORMANCE STATUS: 1 - Symptomatic but completely ambulatory  Blood pressure (!) 130/91, pulse 72, temperature 98.4 F (36.9 C), temperature source Oral, resp. rate 15, weight 130 lb 6.4 oz (59.1 kg), SpO2 100 %.  LABORATORY DATA: Lab Results  Component Value Date   WBC 7.1 11/25/2022   HGB 14.0 11/25/2022   HCT 42.6 11/25/2022   MCV 85.2 11/25/2022   PLT 290 11/25/2022      Chemistry      Component Value Date/Time   NA 137 01/17/2022 0352   K 3.9 01/17/2022 0352   CL 101 01/17/2022 0352   CO2 25 01/17/2022 0352   BUN 7 01/17/2022 0352   CREATININE 0.65 01/17/2022 0352      Component Value Date/Time   CALCIUM 9.0 01/17/2022 0352   ALKPHOS 51 01/17/2022 0352   AST 20 01/17/2022 0352   ALT 19 01/17/2022 0352   BILITOT 0.6 01/17/2022 0352       RADIOGRAPHIC STUDIES: No results found.  ASSESSMENT AND PLAN: This is a very pleasant 52 years old white female with history of iron deficiency with no significant anemia.  Her iron deficiency is of unclear etiology but it could be related to malabsorption issues especially with her daily drinking of black tea.  The patient is also vegetarian and does not eat a lot of meat but she is taking iron supplements. She was treated with Feraheme infusion but unfortunately has anaphylactic reaction to the treatment and this was discontinued. She is currently on Integra +1 capsule p.o. daily and I recommended for her to continue this treatment for now. She had repeat CBC today that showed hemoglobin of 14.0 and hematocrit 42.6% with normal total white blood count and platelets count.  Iron study showed normal serum iron of 50 with iron saturation of 15% and normal TIBC.  She has normal vitamin B12 level.  Ferritin level, serum folate, serum copper and ceruloplasmin as well as TSH  are still pending. I recommended for the patient to continue on the iron supplement with Integra +1 capsule every day or every other day.  I also advised her to avoid drinking a lot of black tea specially at the mealtime. I will see her back for follow-up visit in around 3 months for evaluation and repeat blood work. The patient was also advised to consult with her hormonal therapist for adjustment of her hormones especially with the perimenopausal hormonal changes. She was advised to call immediately if she has any other concerning symptoms in the interval. The patient voices understanding of current disease status and treatment options and is in agreement with the current care plan.  All questions were answered. The patient knows to call the clinic with any problems, questions or concerns. We can certainly see the patient much sooner if necessary.  The total time spent in the appointment was 30 minutes.  Disclaimer: This note was dictated with voice recognition software. Similar sounding words can inadvertently be transcribed and may not be corrected upon review.

## 2022-11-26 LAB — TSH: TSH: 3.347 u[IU]/mL (ref 0.350–4.500)

## 2022-11-26 LAB — FERRITIN: Ferritin: 240 ng/mL (ref 11–307)

## 2022-11-27 LAB — CERULOPLASMIN: Ceruloplasmin: 24.5 mg/dL (ref 19.0–39.0)

## 2022-11-30 LAB — COPPER, SERUM: Copper: 94 ug/dL (ref 80–158)

## 2022-12-08 ENCOUNTER — Ambulatory Visit: Payer: Commercial Managed Care - HMO | Admitting: Internal Medicine

## 2022-12-08 ENCOUNTER — Other Ambulatory Visit: Payer: Commercial Managed Care - HMO

## 2022-12-15 ENCOUNTER — Inpatient Hospital Stay: Payer: Commercial Managed Care - HMO | Admitting: Hematology & Oncology

## 2022-12-15 ENCOUNTER — Encounter: Payer: Self-pay | Admitting: Hematology & Oncology

## 2022-12-15 ENCOUNTER — Other Ambulatory Visit: Payer: Self-pay

## 2022-12-15 ENCOUNTER — Inpatient Hospital Stay: Payer: Commercial Managed Care - HMO | Attending: Physician Assistant

## 2022-12-15 VITALS — BP 134/88 | HR 71 | Temp 98.1°F | Resp 17

## 2022-12-15 DIAGNOSIS — D530 Protein deficiency anemia: Secondary | ICD-10-CM

## 2022-12-15 DIAGNOSIS — D500 Iron deficiency anemia secondary to blood loss (chronic): Secondary | ICD-10-CM

## 2022-12-15 DIAGNOSIS — D509 Iron deficiency anemia, unspecified: Secondary | ICD-10-CM

## 2022-12-15 DIAGNOSIS — Z79899 Other long term (current) drug therapy: Secondary | ICD-10-CM | POA: Insufficient documentation

## 2022-12-15 LAB — CBC WITH DIFFERENTIAL (CANCER CENTER ONLY)
Abs Immature Granulocytes: 0.07 10*3/uL (ref 0.00–0.07)
Basophils Absolute: 0 10*3/uL (ref 0.0–0.1)
Basophils Relative: 0 %
Eosinophils Absolute: 0.1 10*3/uL (ref 0.0–0.5)
Eosinophils Relative: 1 %
HCT: 45.5 % (ref 36.0–46.0)
Hemoglobin: 14.9 g/dL (ref 12.0–15.0)
Immature Granulocytes: 1 %
Lymphocytes Relative: 26 %
Lymphs Abs: 2 10*3/uL (ref 0.7–4.0)
MCH: 28.1 pg (ref 26.0–34.0)
MCHC: 32.7 g/dL (ref 30.0–36.0)
MCV: 85.8 fL (ref 80.0–100.0)
Monocytes Absolute: 0.5 10*3/uL (ref 0.1–1.0)
Monocytes Relative: 6 %
Neutro Abs: 4.8 10*3/uL (ref 1.7–7.7)
Neutrophils Relative %: 66 %
Platelet Count: 290 10*3/uL (ref 150–400)
RBC: 5.3 MIL/uL — ABNORMAL HIGH (ref 3.87–5.11)
RDW: 14.7 % (ref 11.5–15.5)
WBC Count: 7.5 10*3/uL (ref 4.0–10.5)
nRBC: 0 % (ref 0.0–0.2)

## 2022-12-15 LAB — IRON AND IRON BINDING CAPACITY (CC-WL,HP ONLY)
Iron: 93 ug/dL (ref 28–170)
Saturation Ratios: 30 % (ref 10.4–31.8)
TIBC: 307 ug/dL (ref 250–450)
UIBC: 214 ug/dL (ref 148–442)

## 2022-12-15 LAB — RETICULOCYTES
Immature Retic Fract: 6.7 % (ref 2.3–15.9)
RBC.: 5.36 MIL/uL — ABNORMAL HIGH (ref 3.87–5.11)
Retic Count, Absolute: 75.6 10*3/uL (ref 19.0–186.0)
Retic Ct Pct: 1.4 % (ref 0.4–3.1)

## 2022-12-15 LAB — FERRITIN: Ferritin: 176 ng/mL (ref 11–307)

## 2022-12-15 NOTE — Progress Notes (Signed)
Hematology and Oncology Follow Up Visit  Tracey Proctor [id number] [birth year] 52 y.o. 12/15/2022   Principle Diagnosis:  Iron deficiency anemia-likely menorrhagia  Current Therapy:   IV iron as indicated-anaphylactic reaction to Feraheme     Interim History:  Ms. Tracey Proctor is in for first office visit.  She was seen at the [facility] cancer center.  She is originally from [place].  It was certainly fun talking with her.  She sees Dr. Brunilda Payor as her family doctor.  She has had problems with iron deficiency.  She still has her monthly cycles.  I am surprised by this.  She is seen by Dr. Billy Coast for her gynecologic care.  She apparently had a dose of Feraheme on 11/13/2022.  She had a very bad reaction to this.  She currently is taking some oral iron right now.  She is a vegetarian.  She likes to take more natural products.  She said that she feels great.  She feels the best that she has felt in a while.  We will have to see what her iron studies look like.  Again, she does have her monthly cycles.  I am sure that the hopefully will be able to stop soon.  I am sure that she probably loses iron through her monthly cycle.  She works as a Museum/gallery curator.  This is incredibly interesting.  She does have problems with her right shoulder.  She has had multiple surgeries.  She has had chronic health issues related to the shoulder surgery.  She does not have any problems with bowels or bladder.  She is very diligent with her yearly mammograms.  She does the Cologuard test.  Currently, I would have said that her performance status is probably ECOG 0.  Medications:  Current Outpatient Medications:    cholecalciferol (VITAMIN D3) 25 MCG (1000 UNIT) tablet, Take 1,000 Units by mouth daily., Disp: , Rfl:    cyclobenzaprine (FLEXERIL) 5 MG tablet, Take 5 mg by mouth 3 (three) times daily as needed for muscle spasms., Disp: , Rfl:    EPINEPHrine 0.3 mg/0.3 mL IJ SOAJ injection, Inject into the  muscle., Disp: , Rfl:    estradiol (VIVELLE-DOT) 0.05 MG/24HR patch, 1 patch 2 (two) times a week., Disp: , Rfl:    HYDROcodone-acetaminophen (NORCO/VICODIN) 5-325 MG tablet, Take 1 tablet by mouth every 6 (six) hours as needed for moderate pain., Disp: , Rfl:    Multiple Vitamins-Minerals (MULTIVITAMIN ADULTS PO), Take 1 tablet by mouth daily., Disp: , Rfl:    NON FORMULARY, Take 10 mg by mouth 2 (two) times daily. Gaia Liquid Iron, Disp: , Rfl:    progesterone (PROMETRIUM) 100 MG capsule, Take 100 mg by mouth at bedtime., Disp: , Rfl:    topiramate (TOPAMAX) 25 MG tablet, Take 25 mg by mouth daily., Disp: , Rfl:    UNABLE TO FIND, Testosterone cream -compounded, Disp: , Rfl:    FeFum-FePoly-FA-B Cmp-C-Biot (INTEGRA PLUS) CAPS, Take 1 capsule by mouth every morning. (Patient not taking: Reported on 12/15/2022), Disp: 30 capsule, Rfl: 2   hyoscyamine (ANASPAZ) 0.125 MG TBDP disintergrating tablet, Take 0.25 mg by mouth 2 (two) times daily. (Patient not taking: Reported on 12/15/2022), Disp: , Rfl:   Allergies:  Allergies  Allergen Reactions   Feraheme [Ferumoxytol] Shortness Of Breath and Swelling    Patient developed left sided facial swelling, itching throat and tongue, and shortness of breath at the end of feraheme infusion.    Latex Anaphylaxis   Motrin [  Ibuprofen] Anaphylaxis   Nsaids Anaphylaxis   Other Anaphylaxis    Peaches, Cherries, Plums, Almonds, Apples   Sulfamethoxazole Rash and Swelling   Latex Itching and Swelling   Sulfa Antibiotics Rash    Past Medical History, Surgical history, Social history, and Family History were reviewed and updated.  Review of Systems: Review of Systems  Constitutional: Negative.   HENT:  Negative.    Eyes: Negative.   Respiratory: Negative.    Cardiovascular: Negative.   Gastrointestinal: Negative.   Endocrine: Negative.   Genitourinary: Negative.    Musculoskeletal: Negative.   Skin: Negative.   Neurological: Negative.    Hematological: Negative.   Psychiatric/Behavioral: Negative.      Physical Exam:  oral temperature is 98.1 F (36.7 C). Her blood pressure is 134/88 and her pulse is 71. Her respiration is 17 and oxygen saturation is 99%.   Wt Readings from Last 3 Encounters:  11/25/22 130 lb 6.4 oz (59.1 kg)  11/04/22 128 lb 9.6 oz (58.3 kg)  10/05/16 118 lb (53.5 kg)    Physical Exam Vitals reviewed.  HENT:     Head: Normocephalic and atraumatic.  Eyes:     Pupils: Pupils are equal, round, and reactive to light.  Cardiovascular:     Rate and Rhythm: Normal rate and regular rhythm.     Heart sounds: Normal heart sounds.  Pulmonary:     Effort: Pulmonary effort is normal.     Breath sounds: Normal breath sounds.  Abdominal:     General: Bowel sounds are normal.     Palpations: Abdomen is soft.  Musculoskeletal:        General: No tenderness or deformity. Normal range of motion.     Cervical back: Normal range of motion.  Lymphadenopathy:     Cervical: No cervical adenopathy.  Skin:    General: Skin is warm and dry.     Findings: No erythema or rash.  Neurological:     Mental Status: She is alert and oriented to person, place, and time.  Psychiatric:        Behavior: Behavior normal.        Thought Content: Thought content normal.        Judgment: Judgment normal.      Lab Results  Component Value Date   WBC 7.5 12/15/2022   HGB 14.9 12/15/2022   HCT 45.5 12/15/2022   MCV 85.8 12/15/2022   PLT 290 12/15/2022     Chemistry      Component Value Date/Time   NA 137 01/17/2022 0352   K 3.9 01/17/2022 0352   CL 101 01/17/2022 0352   CO2 25 01/17/2022 0352   BUN 7 01/17/2022 0352   CREATININE 0.65 01/17/2022 0352      Component Value Date/Time   CALCIUM 9.0 01/17/2022 0352   ALKPHOS 51 01/17/2022 0352   AST 20 01/17/2022 0352   ALT 19 01/17/2022 0352   BILITOT 0.6 01/17/2022 0352       Impression and Plan: Ms. Tracey Proctor is a very charming 52 year old female.  Again  it was somewhat fun talking with her.  She is from [place].  She actually is from close to where my grandparents lived in [place].  Again she has a incredibly interesting job.  It is a very specialized job.  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  We will have to see what the iron studies look like.  I am sure that we can give her  IV iron if need be.  I think that if she has low iron we could try her on Fusion Plus.  This might be a reasonable oral iron to try.  If we have to do IV iron, then we could try her on Monoferric.  Typically we have had good success with this.  I know that she is quite worried about having another reaction to the iron.  We will see how well we can do, at least orally,.  I know she is feeling well right now.  Her blood counts certainly is wonderful.  Hopefully, her iron studies will be okay.  We probably have to watch her closely.  We may have to check her iron level in a month or so.  Again we do have to make sure we check her frequently given that she still has her monthly cycles.  I probably would like to see her back myself in about 3 months.   Josph Macho, MD 6/12/20241:42 PM

## 2022-12-16 ENCOUNTER — Encounter: Payer: Self-pay | Admitting: *Deleted

## 2023-01-17 ENCOUNTER — Encounter: Payer: Self-pay | Admitting: Hematology & Oncology

## 2023-01-24 ENCOUNTER — Inpatient Hospital Stay: Payer: Commercial Managed Care - HMO

## 2023-01-26 ENCOUNTER — Other Ambulatory Visit: Payer: Self-pay | Admitting: Physician Assistant

## 2023-01-26 DIAGNOSIS — D509 Iron deficiency anemia, unspecified: Secondary | ICD-10-CM

## 2023-02-01 ENCOUNTER — Encounter: Payer: Self-pay | Admitting: Hematology & Oncology

## 2023-02-03 ENCOUNTER — Other Ambulatory Visit: Payer: Commercial Managed Care - HMO

## 2023-02-03 ENCOUNTER — Ambulatory Visit: Payer: Commercial Managed Care - HMO | Admitting: Internal Medicine

## 2023-02-24 ENCOUNTER — Ambulatory Visit: Payer: Commercial Managed Care - HMO | Admitting: Internal Medicine

## 2023-02-24 ENCOUNTER — Other Ambulatory Visit: Payer: Commercial Managed Care - HMO

## 2023-03-18 ENCOUNTER — Inpatient Hospital Stay: Payer: Commercial Managed Care - HMO

## 2023-03-18 ENCOUNTER — Encounter: Payer: Self-pay | Admitting: Hematology & Oncology

## 2023-03-18 ENCOUNTER — Inpatient Hospital Stay: Payer: Commercial Managed Care - HMO | Admitting: Hematology & Oncology

## 2023-04-06 ENCOUNTER — Encounter: Payer: Self-pay | Admitting: *Deleted

## 2023-08-24 DIAGNOSIS — Z1212 Encounter for screening for malignant neoplasm of rectum: Secondary | ICD-10-CM | POA: Diagnosis not present

## 2023-08-24 DIAGNOSIS — Z1211 Encounter for screening for malignant neoplasm of colon: Secondary | ICD-10-CM | POA: Diagnosis not present

## 2023-09-19 DIAGNOSIS — D2261 Melanocytic nevi of right upper limb, including shoulder: Secondary | ICD-10-CM | POA: Diagnosis not present

## 2023-09-19 DIAGNOSIS — D2272 Melanocytic nevi of left lower limb, including hip: Secondary | ICD-10-CM | POA: Diagnosis not present

## 2023-09-19 DIAGNOSIS — D2271 Melanocytic nevi of right lower limb, including hip: Secondary | ICD-10-CM | POA: Diagnosis not present

## 2023-09-19 DIAGNOSIS — D485 Neoplasm of uncertain behavior of skin: Secondary | ICD-10-CM | POA: Diagnosis not present

## 2023-09-19 DIAGNOSIS — D225 Melanocytic nevi of trunk: Secondary | ICD-10-CM | POA: Diagnosis not present

## 2023-11-09 DIAGNOSIS — D649 Anemia, unspecified: Secondary | ICD-10-CM | POA: Diagnosis not present

## 2023-11-09 DIAGNOSIS — E559 Vitamin D deficiency, unspecified: Secondary | ICD-10-CM | POA: Diagnosis not present

## 2023-11-09 DIAGNOSIS — Z0001 Encounter for general adult medical examination with abnormal findings: Secondary | ICD-10-CM | POA: Diagnosis not present

## 2023-11-09 DIAGNOSIS — E785 Hyperlipidemia, unspecified: Secondary | ICD-10-CM | POA: Diagnosis not present

## 2023-11-14 DIAGNOSIS — R21 Rash and other nonspecific skin eruption: Secondary | ICD-10-CM | POA: Diagnosis not present

## 2023-11-14 DIAGNOSIS — Z1389 Encounter for screening for other disorder: Secondary | ICD-10-CM | POA: Diagnosis not present

## 2023-11-14 DIAGNOSIS — Z0000 Encounter for general adult medical examination without abnormal findings: Secondary | ICD-10-CM | POA: Diagnosis not present

## 2023-12-04 DIAGNOSIS — R059 Cough, unspecified: Secondary | ICD-10-CM | POA: Diagnosis not present

## 2023-12-04 DIAGNOSIS — J069 Acute upper respiratory infection, unspecified: Secondary | ICD-10-CM | POA: Diagnosis not present

## 2023-12-04 DIAGNOSIS — J40 Bronchitis, not specified as acute or chronic: Secondary | ICD-10-CM | POA: Diagnosis not present

## 2023-12-04 DIAGNOSIS — J209 Acute bronchitis, unspecified: Secondary | ICD-10-CM | POA: Diagnosis not present

## 2023-12-04 DIAGNOSIS — R0602 Shortness of breath: Secondary | ICD-10-CM | POA: Diagnosis not present

## 2023-12-04 DIAGNOSIS — R062 Wheezing: Secondary | ICD-10-CM | POA: Diagnosis not present

## 2023-12-12 DIAGNOSIS — R062 Wheezing: Secondary | ICD-10-CM | POA: Diagnosis not present

## 2023-12-12 DIAGNOSIS — L820 Inflamed seborrheic keratosis: Secondary | ICD-10-CM | POA: Diagnosis not present

## 2023-12-12 DIAGNOSIS — J4520 Mild intermittent asthma, uncomplicated: Secondary | ICD-10-CM | POA: Diagnosis not present

## 2023-12-23 DIAGNOSIS — J209 Acute bronchitis, unspecified: Secondary | ICD-10-CM | POA: Diagnosis not present

## 2023-12-23 DIAGNOSIS — M25511 Pain in right shoulder: Secondary | ICD-10-CM | POA: Diagnosis not present

## 2023-12-23 DIAGNOSIS — G8929 Other chronic pain: Secondary | ICD-10-CM | POA: Diagnosis not present

## 2024-03-20 DIAGNOSIS — Z20828 Contact with and (suspected) exposure to other viral communicable diseases: Secondary | ICD-10-CM | POA: Diagnosis not present

## 2024-03-20 DIAGNOSIS — Z01411 Encounter for gynecological examination (general) (routine) with abnormal findings: Secondary | ICD-10-CM | POA: Diagnosis not present

## 2024-03-20 DIAGNOSIS — Z01419 Encounter for gynecological examination (general) (routine) without abnormal findings: Secondary | ICD-10-CM | POA: Diagnosis not present

## 2024-03-20 DIAGNOSIS — N83291 Other ovarian cyst, right side: Secondary | ICD-10-CM | POA: Diagnosis not present

## 2024-03-21 DIAGNOSIS — D485 Neoplasm of uncertain behavior of skin: Secondary | ICD-10-CM | POA: Diagnosis not present

## 2024-03-21 DIAGNOSIS — D2239 Melanocytic nevi of other parts of face: Secondary | ICD-10-CM | POA: Diagnosis not present

## 2024-03-21 DIAGNOSIS — L720 Epidermal cyst: Secondary | ICD-10-CM | POA: Diagnosis not present

## 2024-03-21 DIAGNOSIS — D224 Melanocytic nevi of scalp and neck: Secondary | ICD-10-CM | POA: Diagnosis not present

## 2024-03-21 DIAGNOSIS — D2262 Melanocytic nevi of left upper limb, including shoulder: Secondary | ICD-10-CM | POA: Diagnosis not present

## 2024-03-21 DIAGNOSIS — D225 Melanocytic nevi of trunk: Secondary | ICD-10-CM | POA: Diagnosis not present

## 2024-04-12 DIAGNOSIS — M25552 Pain in left hip: Secondary | ICD-10-CM | POA: Diagnosis not present

## 2024-04-25 DIAGNOSIS — Z7189 Other specified counseling: Secondary | ICD-10-CM | POA: Diagnosis not present

## 2024-04-25 DIAGNOSIS — L570 Actinic keratosis: Secondary | ICD-10-CM | POA: Diagnosis not present

## 2024-04-25 DIAGNOSIS — L821 Other seborrheic keratosis: Secondary | ICD-10-CM | POA: Diagnosis not present

## 2024-04-25 DIAGNOSIS — L820 Inflamed seborrheic keratosis: Secondary | ICD-10-CM | POA: Diagnosis not present

## 2024-04-25 DIAGNOSIS — D1801 Hemangioma of skin and subcutaneous tissue: Secondary | ICD-10-CM | POA: Diagnosis not present

## 2024-04-25 DIAGNOSIS — L814 Other melanin hyperpigmentation: Secondary | ICD-10-CM | POA: Diagnosis not present

## 2024-04-25 DIAGNOSIS — D485 Neoplasm of uncertain behavior of skin: Secondary | ICD-10-CM | POA: Diagnosis not present

## 2024-05-01 DIAGNOSIS — S76012A Strain of muscle, fascia and tendon of left hip, initial encounter: Secondary | ICD-10-CM | POA: Diagnosis not present

## 2024-05-07 DIAGNOSIS — N83201 Unspecified ovarian cyst, right side: Secondary | ICD-10-CM | POA: Diagnosis not present

## 2024-05-07 DIAGNOSIS — Z0000 Encounter for general adult medical examination without abnormal findings: Secondary | ICD-10-CM | POA: Diagnosis not present

## 2024-05-07 DIAGNOSIS — D251 Intramural leiomyoma of uterus: Secondary | ICD-10-CM | POA: Diagnosis not present

## 2024-05-07 DIAGNOSIS — D250 Submucous leiomyoma of uterus: Secondary | ICD-10-CM | POA: Diagnosis not present

## 2024-05-28 DIAGNOSIS — S76012D Strain of muscle, fascia and tendon of left hip, subsequent encounter: Secondary | ICD-10-CM | POA: Diagnosis not present

## 2024-05-28 DIAGNOSIS — B9629 Other Escherichia coli [E. coli] as the cause of diseases classified elsewhere: Secondary | ICD-10-CM | POA: Diagnosis not present

## 2024-05-28 DIAGNOSIS — Z0000 Encounter for general adult medical examination without abnormal findings: Secondary | ICD-10-CM | POA: Diagnosis not present

## 2024-05-28 DIAGNOSIS — N3080 Other cystitis without hematuria: Secondary | ICD-10-CM | POA: Diagnosis not present

## 2024-06-15 DIAGNOSIS — M7632 Iliotibial band syndrome, left leg: Secondary | ICD-10-CM | POA: Diagnosis not present

## 2024-06-15 DIAGNOSIS — M25552 Pain in left hip: Secondary | ICD-10-CM | POA: Diagnosis not present

## 2024-06-15 DIAGNOSIS — M76892 Other specified enthesopathies of left lower limb, excluding foot: Secondary | ICD-10-CM | POA: Diagnosis not present
